# Patient Record
Sex: Male | Born: 1975 | Race: White | Hispanic: No | Smoking: Never smoker
Health system: Southern US, Community
[De-identification: ages and names within clinical notes are randomized; demographics above are authoritative.]

## PROBLEM LIST (undated history)

## (undated) DIAGNOSIS — M549 Dorsalgia, unspecified: Secondary | ICD-10-CM

## (undated) DIAGNOSIS — E7800 Pure hypercholesterolemia, unspecified: Secondary | ICD-10-CM

---

## 2007-06-20 ENCOUNTER — Ambulatory Visit: Payer: Self-pay | Admitting: Urology

## 2007-07-02 ENCOUNTER — Ambulatory Visit: Payer: Self-pay | Admitting: Urology

## 2011-05-05 ENCOUNTER — Ambulatory Visit: Payer: Self-pay

## 2013-03-07 ENCOUNTER — Emergency Department: Payer: Self-pay | Admitting: Emergency Medicine

## 2013-03-07 LAB — COMPREHENSIVE METABOLIC PANEL
ALT: 26 U/L (ref 12–78)
Albumin: 3.9 g/dL (ref 3.4–5.0)
Alkaline Phosphatase: 55 U/L
Anion Gap: 7 (ref 7–16)
BUN: 20 mg/dL — ABNORMAL HIGH (ref 7–18)
Bilirubin,Total: 1.1 mg/dL — ABNORMAL HIGH (ref 0.2–1.0)
CHLORIDE: 105 mmol/L (ref 98–107)
Calcium, Total: 8.4 mg/dL — ABNORMAL LOW (ref 8.5–10.1)
Co2: 25 mmol/L (ref 21–32)
Creatinine: 1.44 mg/dL — ABNORMAL HIGH (ref 0.60–1.30)
EGFR (Non-African Amer.): >60
Glucose: 80 mg/dL (ref 65–99)
Osmolality: 275 (ref 275–301)
POTASSIUM: 3.7 mmol/L (ref 3.5–5.1)
SGOT(AST): 25 U/L (ref 15–37)
Sodium: 137 mmol/L (ref 136–145)
Total Protein: 7.6 g/dL (ref 6.4–8.2)

## 2013-03-07 LAB — DRUG SCREEN, URINE
Amphetamines, Ur Screen: POSITIVE (ref ?–1000)
Barbiturates, Ur Screen: NEGATIVE (ref ?–200)
Benzodiazepine, Ur Scrn: NEGATIVE (ref ?–200)
Cannabinoid 50 Ng, Ur ~~LOC~~: NEGATIVE (ref ?–50)
Cocaine Metabolite,Ur ~~LOC~~: NEGATIVE (ref ?–300)
MDMA (Ecstasy)Ur Screen: NEGATIVE (ref ?–500)
METHADONE, UR SCREEN: NEGATIVE (ref ?–300)
Opiate, Ur Screen: NEGATIVE (ref ?–300)
Phencyclidine (PCP) Ur S: NEGATIVE (ref ?–25)
TRICYCLIC, UR SCREEN: NEGATIVE (ref ?–1000)

## 2013-03-07 LAB — URINALYSIS, COMPLETE
BILIRUBIN, UR: NEGATIVE
BLOOD: NEGATIVE
Bacteria: NONE SEEN
Glucose,UR: NEGATIVE mg/dL (ref 0–75)
Leukocyte Esterase: NEGATIVE
Nitrite: NEGATIVE
Ph: 5 (ref 4.5–8.0)
Protein: NEGATIVE
RBC,UR: 1 /HPF (ref 0–5)
SPECIFIC GRAVITY: 1.027 (ref 1.003–1.030)
WBC UR: 4 /HPF (ref 0–5)

## 2013-03-07 LAB — CBC
HCT: 47.3 % (ref 40.0–52.0)
HGB: 16.4 g/dL (ref 13.0–18.0)
MCH: 30.1 pg (ref 26.0–34.0)
MCHC: 34.6 g/dL (ref 32.0–36.0)
MCV: 87 fL (ref 80–100)
Platelet: 193 10*3/uL (ref 150–440)
RBC: 5.43 10*6/uL (ref 4.40–5.90)
RDW: 13.7 % (ref 11.5–14.5)
WBC: 7.2 10*3/uL (ref 3.8–10.6)

## 2013-03-07 LAB — SALICYLATE LEVEL

## 2013-03-07 LAB — ACETAMINOPHEN LEVEL: Acetaminophen: <2 ug/mL

## 2013-03-07 LAB — ETHANOL: Ethanol: <3 mg/dL

## 2014-05-31 NOTE — Consult Note (Signed)
Brief Consult Note: Diagnosis: Major depressive disorder.   Patient was seen by consultant.   Consult note dictated.   Recommend further assessment or treatment.   Comments: [name] has a h/o depression and annxiety treated by Dr. Lucianne [name]. He took four 0.5 mg tablets of newly prescribed Clonazepam. He adamantly denies suicidal intention. He is not suicidal or homicidal today.   PLAN: 1. The patient no longer meets criteria for IVC. I will terminate proceedings. Please discharge as appropriate.  2. He is to continue medications as prescribed by Dr. Lucianne [name]. No Rx necessary.  3. He will follow up with Dr. Lucianne [name] on Feb 12.  Electronic Signatures: George [name], George [name] (MD)  (Signed 30-Jan-15 13:09)  Authored: Brief Consult Note   Last Updated: 30-Jan-15 13:09 by George [name], George [name] (MD)

## 2014-05-31 NOTE — Consult Note (Signed)
George [name] NAME:  George, [name] MR#:  [id number] DATE OF BIRTH:  1975-10-08  DATE OF CONSULTATION:  03/08/2013  REFERRING PHYSICIAN:  Bayard Males, M.D. CONSULTING PHYSICIAN:  Aydee Mcnew B. Deion Swift, MD  REASON FOR CONSULTATION:  To evaluate a George [name] after a suicide attempt.   IDENTIFYING DATA:  George George [name] is a 39 year old male with history of depression, anxiety, and attention deficit/hyperactivity disorder.   CHIEF COMPLAINT:  "I am fine now."   HISTORY OF PRESENT ILLNESS:  George George [name] is a George [name] of Dr. Lucianne Muss who prescribes Viibryd for depression, Adderall for ADHD and started him on clonazepam 0.5 mg twice daily as needed for anxiety.  On George day of admission, George George [name] was very tired after working long hours for several days and unable to fall asleep.  Instead of one tablet for anxiety he took four 0.5 mg tablets at 6:00 in George afternoon hoping to fall asleep.  Indeed he did fall asleep, but was awakened by his girlfriend at 2:00 in George morning.  He was incoherent, unsteady on his feet, stumbling around and his girlfriend called police.  He was brought to George Emergency Room where he was initially too sleepy to be evaluated.  Reportedly he had some allergies and was given Benadryl IM by Emergency Room physician.  We could not talk to this George [name] throughout January George 29th.  George following day he was alert and oriented and was able to provide [place] with his side of George story.  George George [name] has never in his life taken benzodiazepines and did not realize how big effect this type of medication may have.  He adamantly denies that it was a suicide attempt and denies any symptoms of depression or anxiety.  He has been working with Dr. Lucianne Muss since December and feels that George medication that she prescribes has been very helpful.  He has a next appointment with her in February scheduled already.  He denies alcohol or illicit substance use.   PAST PSYCHIATRIC HISTORY:  He has a history of  depression and ADHD, but no suicide attempts.  No hospitalizations.  No substance abuse problems.   FAMILY PSYCHIATRIC HISTORY:  Mother with anxiety.   PAST MEDICAL HISTORY:  None.   ALLERGIES:  No known drug allergies.   MEDICATIONS ON ADMISSION:  Adderall 30 mg daily, Viibryd 40 mg daily, Klonopin 0.5 mg twice daily.   SOCIAL HISTORY:  He is separated from his wife.  He lives with his girlfriend who is a Engineer, site and has four children.  He has a stormy relationship with his girlfriend.  He used to be in therapy, but this stopped since his wife cancelled his insurance.  He is employed.   REVIEW OF SYSTEMS:  CONSTITUTIONAL:  No fevers or chills.  No weight changes.  EYES:  No double or blurred vision.  EARS, NOSE, THROAT:  No hearing loss.  RESPIRATORY:  No shortness of breath or cough.  CARDIOVASCULAR:  No chest pain or orthopnea.  GASTROINTESTINAL:  No abdominal pain, nausea, vomiting, or diarrhea.  GENITOURINARY:  No incontinence or frequency.  ENDOCRINE:  No heat or cold intolerance.  LYMPHATIC:  No anemia or easy bruising.  INTEGUMENTARY:  No acne or rash.  MUSCULOSKELETAL:  No muscle or joint pain.  NEUROLOGIC:  No tingling or weakness.  PSYCHIATRIC:  See history of present illness for details.   PHYSICAL EXAMINATION: VITAL SIGNS:  Blood pressure 141/75, pulse 86, respirations 18, temperature 97.4.  GENERAL:  This is a well-developed male  in no acute distress.  George rest of George physical examination is deferred to his primary attending.   LABORATORY DATA:  Chemistries are within normal limits except for BUN of 20 and creatinine 1.44.  Blood alcohol level 0.  LFTs within normal limits except for bilirubin of 1.1.  Urine tox screen positive for amphetamines.  CBC within normal limits.  Urinalysis is not suggestive of urinary tract infection.  Serum acetaminophen and salicylates are low.   MENTAL STATUS EXAMINATION:  George George [name] is alert and oriented to person, place, time  and situation.  He is pleasant, polite and cooperative.  He is well groomed.  He wears hospital scrubs and a yellow shirt.  He maintains good eye contact.  His speech is of normal rhythm, rate and volume.  His mood is good with full affect.  Thought process is logical and goal oriented.  Thought content:  He denies suicidal or homicidal ideation.  There are no delusions or paranoia.  There are no auditory or visual hallucinations.  His cognition is grossly intact. He registers 3/3 and recalls 3/3 objects after 5 minutes. He can spell "world" forward and backward. He knows current president. His intelligence and fund of knowledge are average. His insight and judgment are fair.    DIAGNOSES: AXIS I:  Major depressive disorder, recurrent, moderate.  Anxiety disorder, not otherwise specified.  Attention deficit hyperactivity disorder.  AXIS II:  Deferred.  AXIS III:  Deferred.  AXIS IV:  Mental illness, marital conflict, relationship problems.  AXIS V:  Global assessment of functioning 55.   PLAN:   1.  George George [name] no longer meets criteria for involuntary inpatient psychiatric commitment.  Please discharge as appropriate.  I will terminate proceedings.   2.  He is to continue all medications as prescribed by Dr. Lucianne MussLima.  No prescriptions necessary.  3.  He will follow up with Dr. Lucianne MussLima on February George 12th.    ____________________________ Ellin GoodieJolanta B. Jennet MaduroPucilowska, MD jbp:ea D: 03/08/2013 22:18:24 ET T: 03/09/2013 03:40:23 ET JOB#: [id number]  cc: Nabeel Gladson B. Jennet MaduroPucilowska, MD, <Dictator> Shari ProwsJOLANTA B Renuka Farfan MD ELECTRONICALLY SIGNED 04/06/2013 6:44

## 2019-07-15 ENCOUNTER — Encounter: Payer: Self-pay | Admitting: Emergency Medicine

## 2019-07-15 ENCOUNTER — Emergency Department: Payer: BLUE CROSS/BLUE SHIELD

## 2019-07-15 ENCOUNTER — Other Ambulatory Visit: Payer: Self-pay

## 2019-07-15 ENCOUNTER — Emergency Department
Admission: EM | Admit: 2019-07-15 | Discharge: 2019-07-15 | Disposition: A | Discharge status: Home / Self Care | Payer: BLUE CROSS/BLUE SHIELD | Attending: Emergency Medicine | Admitting: Emergency Medicine

## 2019-07-15 DIAGNOSIS — Z79899 Other long term (current) drug therapy: Secondary | ICD-10-CM | POA: Insufficient documentation

## 2019-07-15 DIAGNOSIS — M5412 Radiculopathy, cervical region: Secondary | ICD-10-CM | POA: Insufficient documentation

## 2019-07-15 DIAGNOSIS — R0602 Shortness of breath: Secondary | ICD-10-CM | POA: Insufficient documentation

## 2019-07-15 DIAGNOSIS — M5410 Radiculopathy, site unspecified: Secondary | ICD-10-CM

## 2019-07-15 DIAGNOSIS — M542 Cervicalgia: Secondary | ICD-10-CM | POA: Diagnosis present

## 2019-07-15 HISTORY — DX: Dorsalgia, unspecified: M54.9

## 2019-07-15 HISTORY — DX: Pure hypercholesterolemia, unspecified: E78.00

## 2019-07-15 LAB — CBC WITH DIFFERENTIAL/PLATELET
Abs Immature Granulocytes: 0.01 10*3/uL (ref 0.00–0.07)
Basophils Absolute: 0 10*3/uL (ref 0.0–0.1)
Basophils Relative: 1 %
Eosinophils Absolute: 0 10*3/uL (ref 0.0–0.5)
Eosinophils Relative: 0 %
HCT: 44 % (ref 39.0–52.0)
Hemoglobin: 15.5 g/dL (ref 13.0–17.0)
Immature Granulocytes: 0 %
Lymphocytes Relative: 38 %
Lymphs Abs: 2.3 10*3/uL (ref 0.7–4.0)
MCH: 29.2 pg (ref 26.0–34.0)
MCHC: 35.2 g/dL (ref 30.0–36.0)
MCV: 83 fL (ref 80.0–100.0)
Monocytes Absolute: 0.4 10*3/uL (ref 0.1–1.0)
Monocytes Relative: 7 %
Neutro Abs: 3.2 10*3/uL (ref 1.7–7.7)
Neutrophils Relative %: 54 %
Platelets: 230 10*3/uL (ref 150–400)
RBC: 5.3 MIL/uL (ref 4.22–5.81)
RDW: 12.6 % (ref 11.5–15.5)
WBC: 6 10*3/uL (ref 4.0–10.5)
nRBC: 0 % (ref 0.0–0.2)

## 2019-07-15 LAB — COMPREHENSIVE METABOLIC PANEL
ALT: 28 U/L (ref 0–44)
AST: 22 U/L (ref 15–41)
Albumin: 4.5 g/dL (ref 3.5–5.0)
Alkaline Phosphatase: 65 U/L (ref 38–126)
Anion gap: 10 (ref 5–15)
BUN: 13 mg/dL (ref 6–20)
CO2: 23 mmol/L (ref 22–32)
Calcium: 9.1 mg/dL (ref 8.9–10.3)
Chloride: 107 mmol/L (ref 98–111)
Creatinine, Ser: 1.22 mg/dL (ref 0.61–1.24)
GFR calc Af Amer: >60 mL/min (ref >60)
GFR calc non Af Amer: >60 mL/min (ref >60)
Glucose, Bld: 95 mg/dL (ref 70–99)
Potassium: 4 mmol/L (ref 3.5–5.1)
Sodium: 140 mmol/L (ref 135–145)
Total Bilirubin: 0.9 mg/dL (ref 0.3–1.2)
Total Protein: 7.6 g/dL (ref 6.5–8.1)

## 2019-07-15 LAB — TROPONIN I (HIGH SENSITIVITY): Troponin I (High Sensitivity): <2 ng/L (ref <18)

## 2019-07-15 LAB — FIBRIN DERIVATIVES D-DIMER (ARMC ONLY): Fibrin derivatives D-dimer (ARMC): 245.38 ng/mL (FEU) (ref 0.00–499.00)

## 2019-07-15 NOTE — ED Triage Notes (Signed)
EMS reports they were called out for "pressure" in right side of neck and right arm. Patient and family concerned of possible cardiac issues d/t family history. 12 lead unremarkable. Patient has hx of herniated disc.

## 2019-07-15 NOTE — ED Provider Notes (Signed)
[facility] Emergency Department Provider Note ____________________________________________   First MD Initiated Contact with Patient 07/15/19 1420     (approximate)  I have reviewed the triage vital signs and the nursing notes.   HISTORY  Chief Complaint Neck Pain  HPI George [name] is a 44 y.o. malewith a history of back pain and high cholesterol on statin who presents to the emergency department for treatment of right and left neck pain that started several days ago. Pain is worse on the right today and radiates into his right arm. No known injury. He is also having some shortness of breath with exertion. He denies chest pain. No relief with Naprosyn.      Past Medical History:  Diagnosis Date  . Back pain   . Hypercholesterolemia     There are no problems to display for this patient.   History reviewed. No pertinent surgical history.  Prior to Admission medications   Medication Sig Start Date End Date Taking? Authorizing Provider  naproxen (NAPROSYN) 500 MG tablet Take 500 mg by mouth 2 (two) times daily with a meal.   Yes [provider]  rosuvastatin (CRESTOR) 5 MG tablet Take 5 mg by mouth daily.   Yes [provider]  traZODone (DESYREL) 100 MG tablet Take 100 mg by mouth at bedtime.   Yes [provider]    Allergies Patient has no known allergies.  No family history on file.  Social History Social History   Tobacco Use  . Smoking status: Never Smoker  . Smokeless tobacco: Never Used  Substance Use Topics  . Alcohol use: Never  . Drug use: Not on file    Review of Systems  Constitutional: No fever/chills Eyes: No visual changes. ENT: No sore throat. Cardiovascular: Denies chest pain. Respiratory: Positive for shortness of breath. Gastrointestinal: No abdominal pain.  No nausea, no vomiting.  No diarrhea.  No constipation. Genitourinary: Negative for dysuria. Musculoskeletal: Positive for  neck pain. Negative for back pain. Skin: Negative for rash. Neurological: Negative for headaches, focal weakness or numbness. ___________________________________________   PHYSICAL EXAM:  VITAL SIGNS: ED Triage Vitals  Enc Vitals Group     BP 07/15/19 1421 140/78     Pulse Rate 07/15/19 1421 78     Resp 07/15/19 1421 18     Temp 07/15/19 1421 98.7 F (37.1 C)     Temp Source 07/15/19 1421 Oral     SpO2 07/15/19 1421 98 %     Weight 07/15/19 1416 220 lb (99.8 kg)     Height 07/15/19 1416 6' (1.829 m)     Head Circumference --      Peak Flow --      Pain Score 07/15/19 1416 2     Pain Loc --      Pain Edu? --      Excl. in GC? --     Constitutional: Alert and oriented. Well appearing and in no acute distress. Eyes: Conjunctivae are normal. Head: Atraumatic. Nose: No congestion/rhinnorhea. Mouth/Throat: Mucous membranes are moist.  Oropharynx non-erythematous. Neck: No stridor.   Hematological/Lymphatic/Immunilogical: No cervical lymphadenopathy. Cardiovascular: Normal rate, regular rhythm. Grossly normal heart sounds.  Good peripheral circulation. Respiratory: Normal respiratory effort.  No retractions. Lungs CTAB. Gastrointestinal: Soft and nontender. No distention. No abdominal bruits. No CVA tenderness. Genitourinary:  Musculoskeletal: No focal tenderness over either side of the neck. No focal midline tenderness. No lower extremity tenderness nor edema.  No joint effusions. Neurologic:  Normal speech  and language. No gross focal neurologic deficits are appreciated. No gait instability. Skin:  Skin is warm, dry and intact. No rash noted. Psychiatric: Mood and affect are normal. Speech and behavior are normal.  ____________________________________________   LABS (all labs ordered are listed, but only abnormal results are displayed)  Labs Reviewed  CBC WITH DIFFERENTIAL/PLATELET  COMPREHENSIVE METABOLIC PANEL  FIBRIN DERIVATIVES D-DIMER ([facility] ONLY)  TROPONIN I (HIGH  SENSITIVITY)  TROPONIN I (HIGH SENSITIVITY)   ____________________________________________  EKG  ED ECG REPORT I, Thalya Fouche, FNP-BC personally viewed and interpreted this ECG.   Date: 07/15/2019  EKG Time: 1518  Rate: 85  Rhythm: normal sinus rhythm  Axis: normal  Intervals:none  ST&T Change: no ST elevation  ____________________________________________  RADIOLOGY  ED MD interpretation:    Chest x-ray is negative for acute cardiopulmonary abnormality. I, Kem Boroughs, personally viewed and evaluated these images (plain radiographs) as part of my medical decision making, as well as reviewing the written report by the radiologist.  Official radiology report(s): DG Chest 2 View  Result Date: 07/15/2019 CLINICAL DATA:  Shortness of breath EXAM: CHEST - 2 VIEW COMPARISON:  None. FINDINGS: Lungs are clear. Heart size and pulmonary vascularity are normal. No adenopathy. No pneumothorax. No bone lesions. IMPRESSION: Lungs clear.  Cardiac silhouette within normal limits. Electronically Signed   By: Bretta Bang III M.D.   On: 07/15/2019 15:22    ____________________________________________   PROCEDURES  Procedure(s) performed (including Critical Care):  Procedures  ____________________________________________   INITIAL IMPRESSION / ASSESSMENT AND PLAN     44 year old male presenting to the emergency department for treatment and evaluation of vague symptoms as described in the HPI.  He is concerned that he may have something wrong with his carotid arteries or his heart or his lungs.  He states that his dad had heart disease at age 6.  He himself has not had any cardiac history.  He denies any recent injury.  Sometimes his neck hurts on the right side sometimes it hurts on the left side.  Plan will be to do some screening labs including a troponin and a D-dimer since he has had some occasional shortness of breath with exertion.  We will also get an EKG and chest  x-ray.  DIFFERENTIAL DIAGNOSIS  Anxiety, CAD, PE, musculoskeletal pain, cervical and/or lumbar radiculopathy  ED COURSE Troponin is negative, D-dimer is normal, and CBC and CMP are completely unremarkable.  Chest x-ray is normal.  EKG is normal.  Today symptoms most likely related to anxiety.  Patient has an appointment scheduled with both his orthopedist as well as primary care.  He was offered prescription for meloxicam to be taken in place of Naprosyn but he states that he had tried that in the past and was taken off of it.  He was encouraged to continue his routine daily medicines and keep his scheduled appointments. ____________________________________________   FINAL CLINICAL IMPRESSION(S) / ED DIAGNOSES  Final diagnoses:  Shortness of breath  Radicular pain     ED Discharge Orders    None       Alonza Knisley Kyler was evaluated in Emergency Department on 07/15/2019 for the symptoms described in the history of present illness. He was evaluated in the context of the global COVID-19 pandemic, which necessitated consideration that the patient might be at risk for infection with the SARS-CoV-2 virus that causes COVID-19. Institutional protocols and algorithms that pertain to the evaluation of patients at risk for COVID-19 are in a state of  rapid change based on information released by regulatory bodies including the CDC and federal and state organizations. These policies and algorithms were followed during the patient's care in the ED.   Note:  This document was prepared using Dragon voice recognition software and may include unintentional dictation errors.   Victorino Dike, FNP 07/15/19 1751    Earleen Newport, MD 07/16/19 [phone]

## 2019-07-15 NOTE — Discharge Instructions (Signed)
Please follow-up with your orthopedic doctor as scheduled.  Continue taking your Naprosyn as prescribed.  You may also take Tylenol in addition to the Naprosyn.  Please discuss all of your symptoms with your primary care provider as well as your orthopedist.  Return to the emergency department for symptoms of change or worsen if you are unable to see primary care right away.

## 2019-11-14 ENCOUNTER — Emergency Department: Payer: BLUE CROSS/BLUE SHIELD

## 2019-11-14 ENCOUNTER — Encounter: Payer: Self-pay | Admitting: *Deleted

## 2019-11-14 ENCOUNTER — Other Ambulatory Visit: Payer: Self-pay

## 2019-11-14 ENCOUNTER — Emergency Department
Admission: EM | Admit: 2019-11-14 | Discharge: 2019-11-15 | Disposition: A | Discharge status: Home / Self Care | Payer: BLUE CROSS/BLUE SHIELD | Attending: Emergency Medicine | Admitting: Emergency Medicine

## 2019-11-14 DIAGNOSIS — R0781 Pleurodynia: Secondary | ICD-10-CM | POA: Diagnosis not present

## 2019-11-14 DIAGNOSIS — Z5321 Procedure and treatment not carried out due to patient leaving prior to being seen by health care provider: Secondary | ICD-10-CM | POA: Diagnosis not present

## 2019-11-14 DIAGNOSIS — R059 Cough, unspecified: Secondary | ICD-10-CM | POA: Insufficient documentation

## 2019-11-14 LAB — BASIC METABOLIC PANEL
Anion gap: 11 (ref 5–15)
BUN: 17 mg/dL (ref 6–20)
CO2: 25 mmol/L (ref 22–32)
Calcium: 9.3 mg/dL (ref 8.9–10.3)
Chloride: 102 mmol/L (ref 98–111)
Creatinine, Ser: 1.08 mg/dL (ref 0.61–1.24)
GFR calc non Af Amer: >60 mL/min (ref >60)
Glucose, Bld: 120 mg/dL — ABNORMAL HIGH (ref 70–99)
Potassium: 4.7 mmol/L (ref 3.5–5.1)
Sodium: 138 mmol/L (ref 135–145)

## 2019-11-14 LAB — TROPONIN I (HIGH SENSITIVITY): Troponin I (High Sensitivity): 4 ng/L (ref <18)

## 2019-11-14 LAB — CBC
HCT: 42.1 % (ref 39.0–52.0)
Hemoglobin: 14 g/dL (ref 13.0–17.0)
MCH: 28.7 pg (ref 26.0–34.0)
MCHC: 33.3 g/dL (ref 30.0–36.0)
MCV: 86.4 fL (ref 80.0–100.0)
Platelets: 611 10*3/uL — ABNORMAL HIGH (ref 150–400)
RBC: 4.87 MIL/uL (ref 4.22–5.81)
RDW: 13 % (ref 11.5–15.5)
WBC: 14.7 10*3/uL — ABNORMAL HIGH (ref 4.0–10.5)
nRBC: 0 % (ref 0.0–0.2)

## 2019-11-14 MED ORDER — ACETAMINOPHEN 325 MG PO TABS
650.0000 mg | ORAL_TABLET | Freq: Once | ORAL | Status: AC
  Administered 2019-11-14: 650 mg via ORAL
  Filled 2019-11-14: qty 2

## 2019-11-14 NOTE — ED Notes (Signed)
Pt jumps up out of w/c and begins yelling and cursing at other patients sitting in lobby because their "phone is too loud"; pt then exits lobby cont to scream and curse; security notified and pt st leaving

## 2019-11-14 NOTE — ED Triage Notes (Signed)
Pt to triage via wheelchair  Pt sent from [facility] for eval of sob, covid.  Pt states everyone in his family has covid.  Pt has a cough and right rib pain.  Sx for 2 days.   Pt alert, speech clear.

## 2019-11-14 NOTE — ED Triage Notes (Signed)
First nurse note- here for covid sx, sx bad for 16 days. Sat 93 at check in. Tachy and febrile.

## 2019-12-30 ENCOUNTER — Other Ambulatory Visit: Payer: Self-pay | Admitting: *Deleted

## 2021-03-02 ENCOUNTER — Other Ambulatory Visit: Payer: Self-pay | Admitting: Family Medicine

## 2021-03-02 DIAGNOSIS — R1011 Right upper quadrant pain: Secondary | ICD-10-CM

## 2021-03-10 ENCOUNTER — Ambulatory Visit
Admission: RE | Admit: 2021-03-10 | Discharge: 2021-03-10 | Disposition: A | Discharge status: Home / Self Care | Payer: 59 | Source: Ambulatory Visit | Attending: Family Medicine | Admitting: Family Medicine

## 2021-03-10 ENCOUNTER — Other Ambulatory Visit: Payer: Self-pay

## 2021-03-10 DIAGNOSIS — R1011 Right upper quadrant pain: Secondary | ICD-10-CM | POA: Diagnosis present

## 2021-04-13 ENCOUNTER — Other Ambulatory Visit: Payer: Self-pay | Admitting: Neurosurgery

## 2021-04-13 DIAGNOSIS — M545 Low back pain, unspecified: Secondary | ICD-10-CM

## 2021-04-13 DIAGNOSIS — G8929 Other chronic pain: Secondary | ICD-10-CM

## 2021-04-21 ENCOUNTER — Ambulatory Visit: Payer: 59

## 2021-09-13 IMAGING — CR DG CHEST 2V
1 series · 2 of 2 positions shown · non-contrast
Comparison: None.

CLINICAL DATA: Shortness of breath

EXAM:
CHEST - 2 VIEW

[Series 1: dg chest 2 view · 0.14mm/px · 2 of 2 slices shown]
[im 1/2]
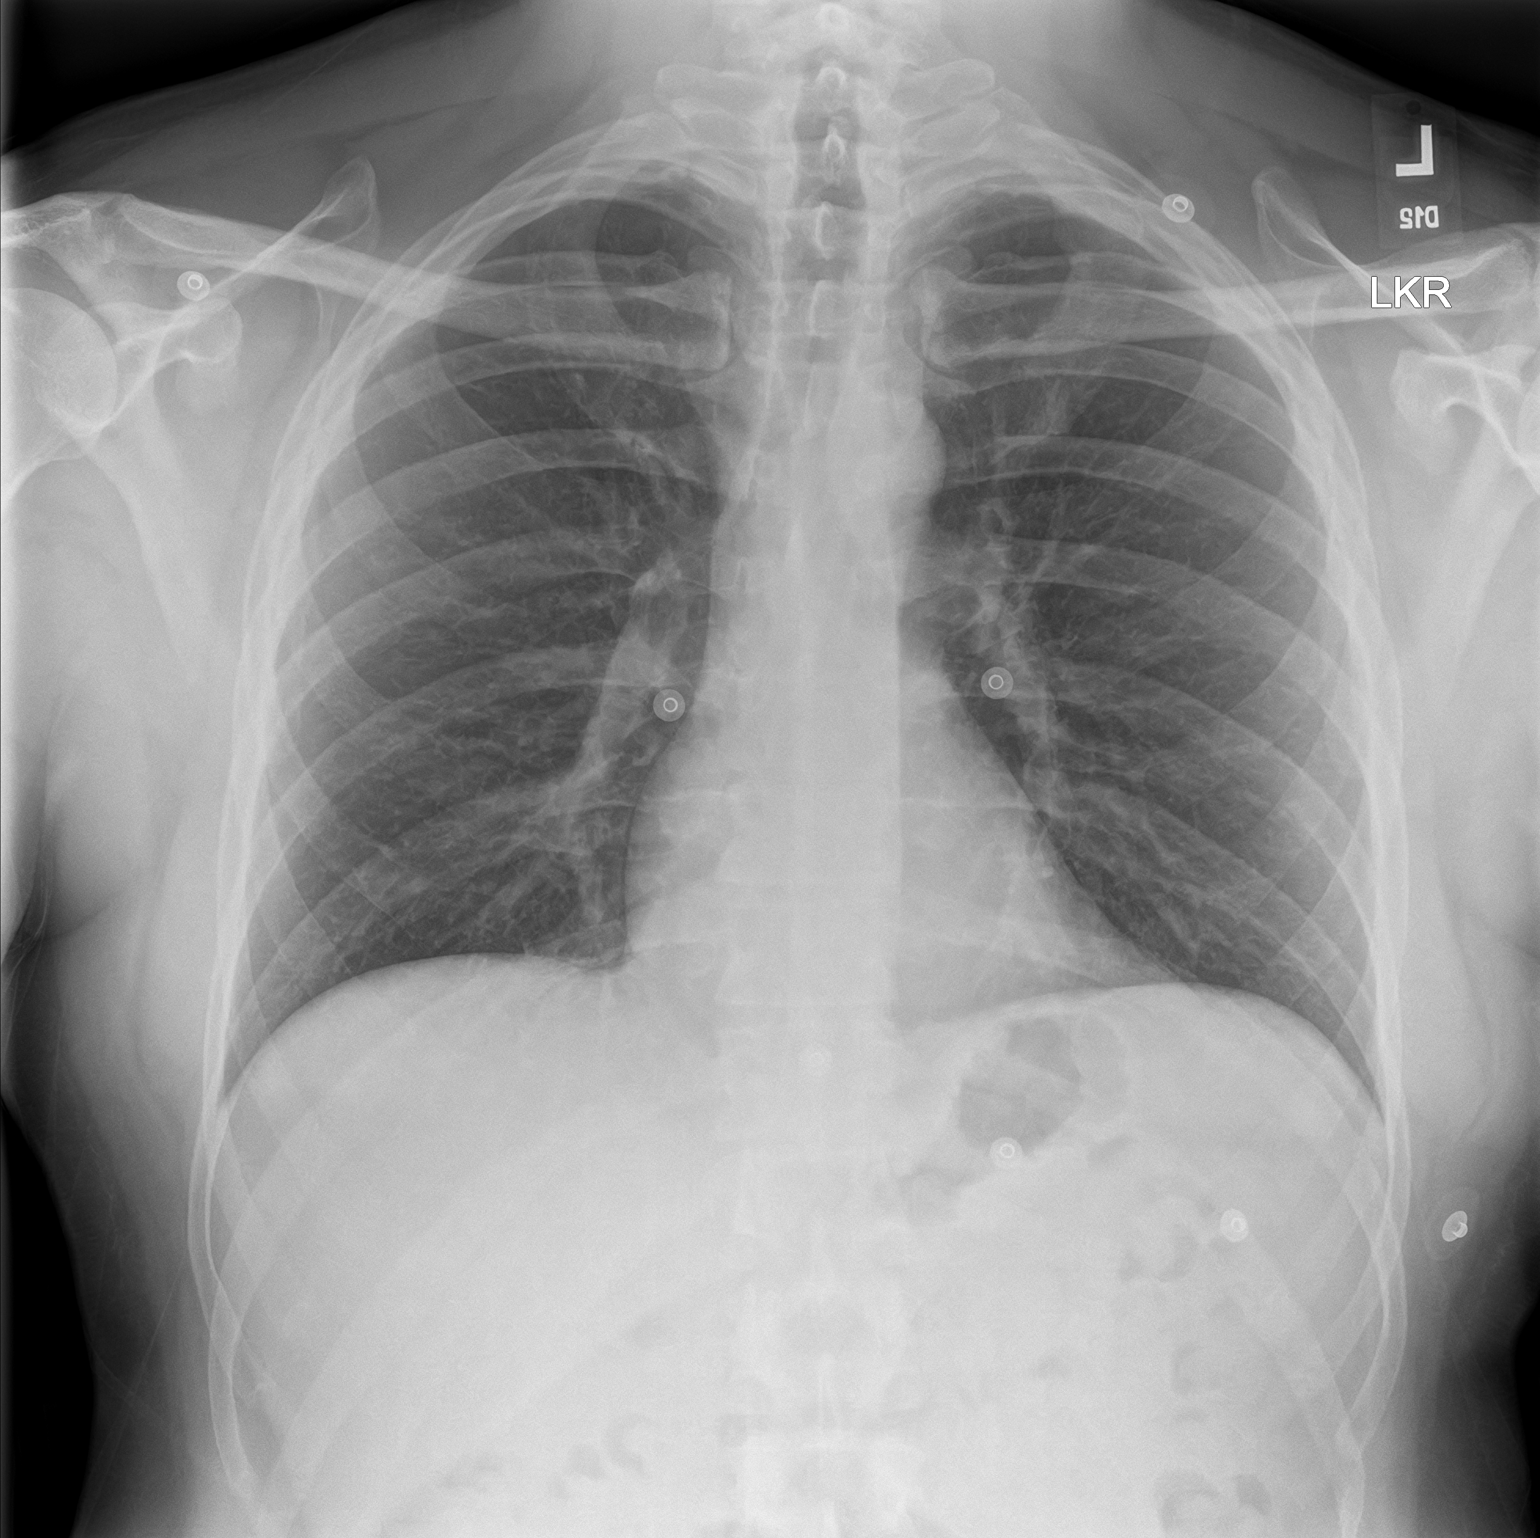
[im 2/2]
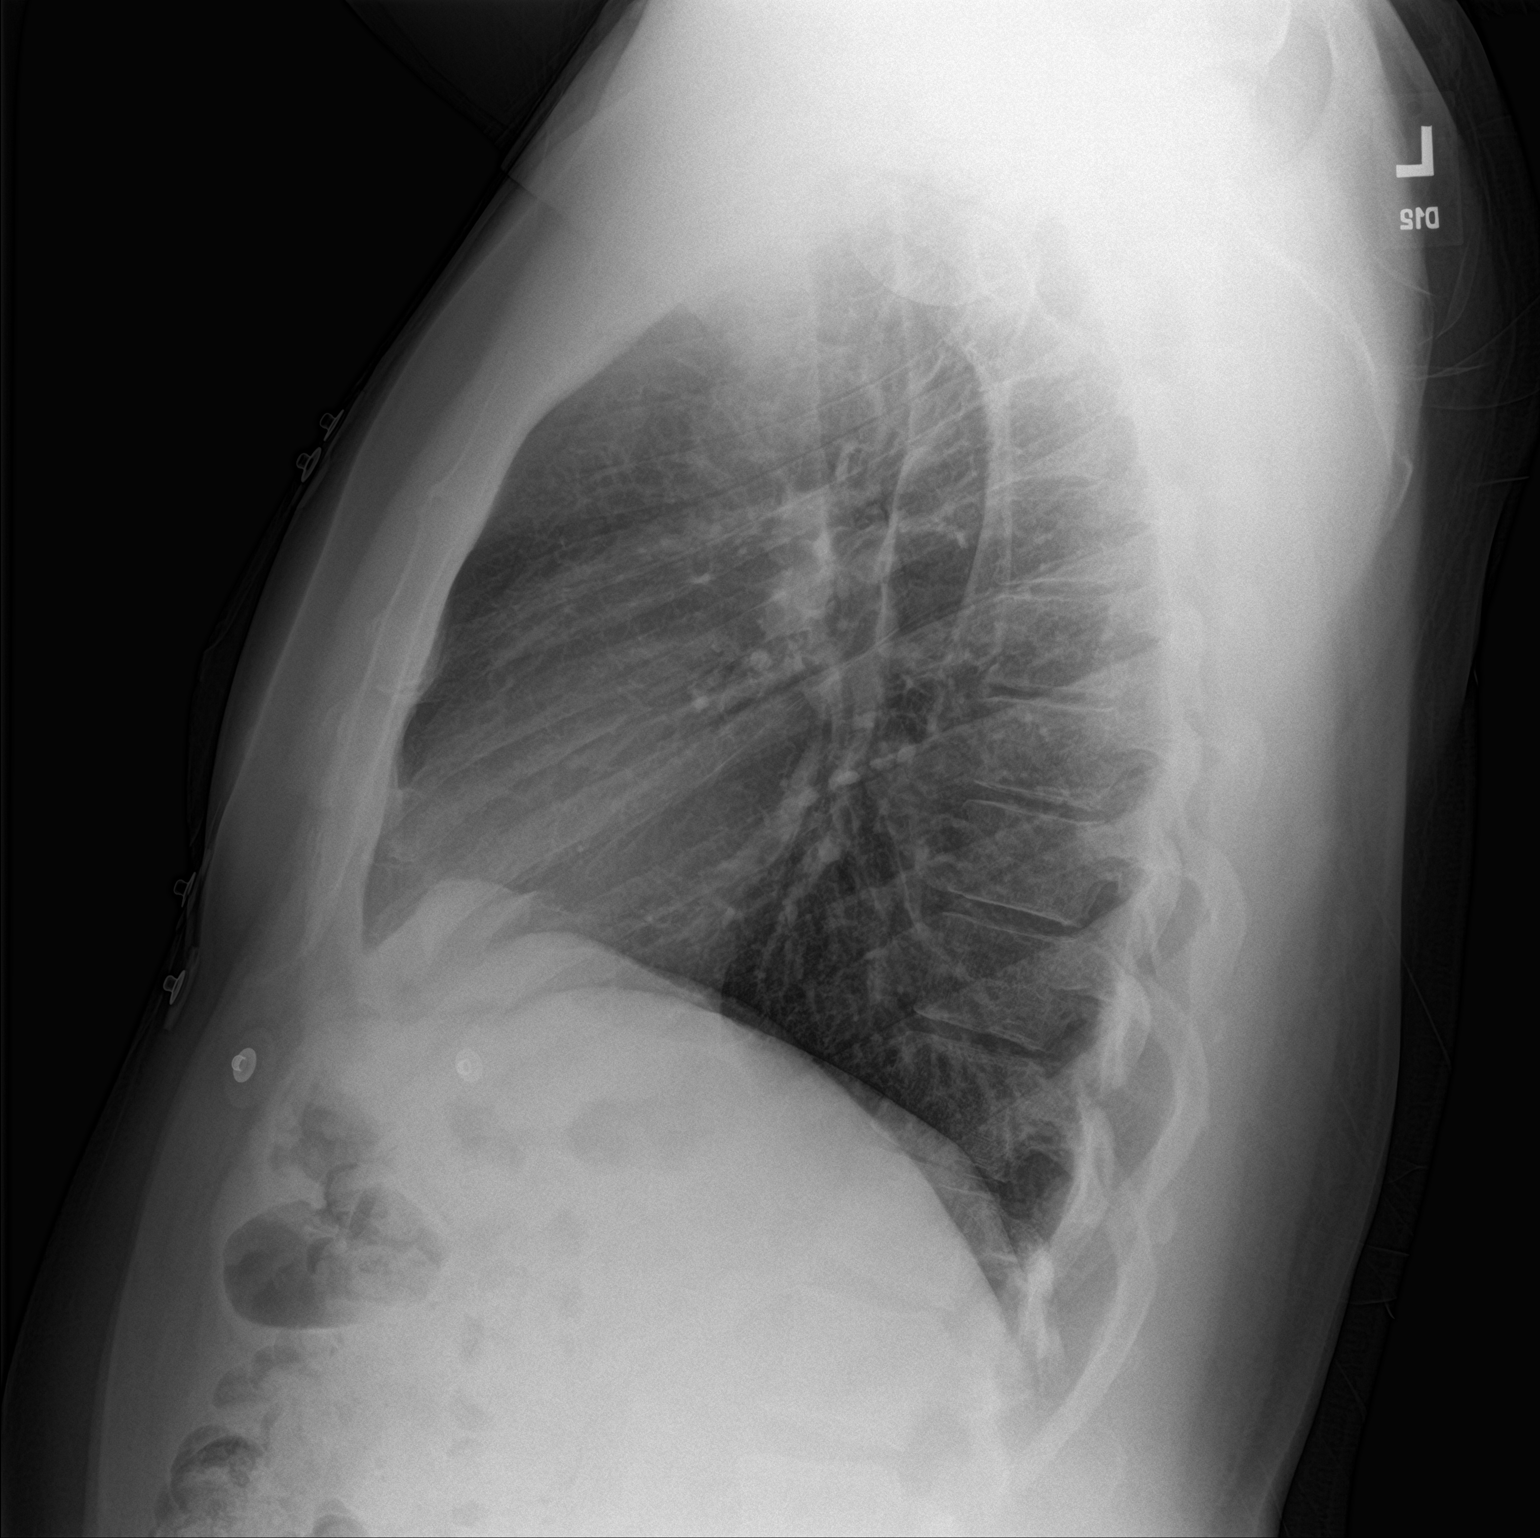

[2 of 2 positions shown; findings below may reference images not displayed]

FINDINGS: Lungs are clear. Heart size and pulmonary vascularity are normal. No
adenopathy. No pneumothorax. No bone lesions.
IMPRESSION: Lungs clear.  Cardiac silhouette within normal limits.

## 2022-01-13 IMAGING — CR DG CHEST 2V
1 series · 2 of 2 positions shown · non-contrast
Comparison: Chest radiograph dated 07/15/2019.

CLINICAL DATA: 44-year-old male with cough. [id number] exposure.

EXAM:
CHEST - 2 VIEW

[Series 1: w chest pa · 0.14mm/px · 2 of 2 slices shown]
[im 1/2]
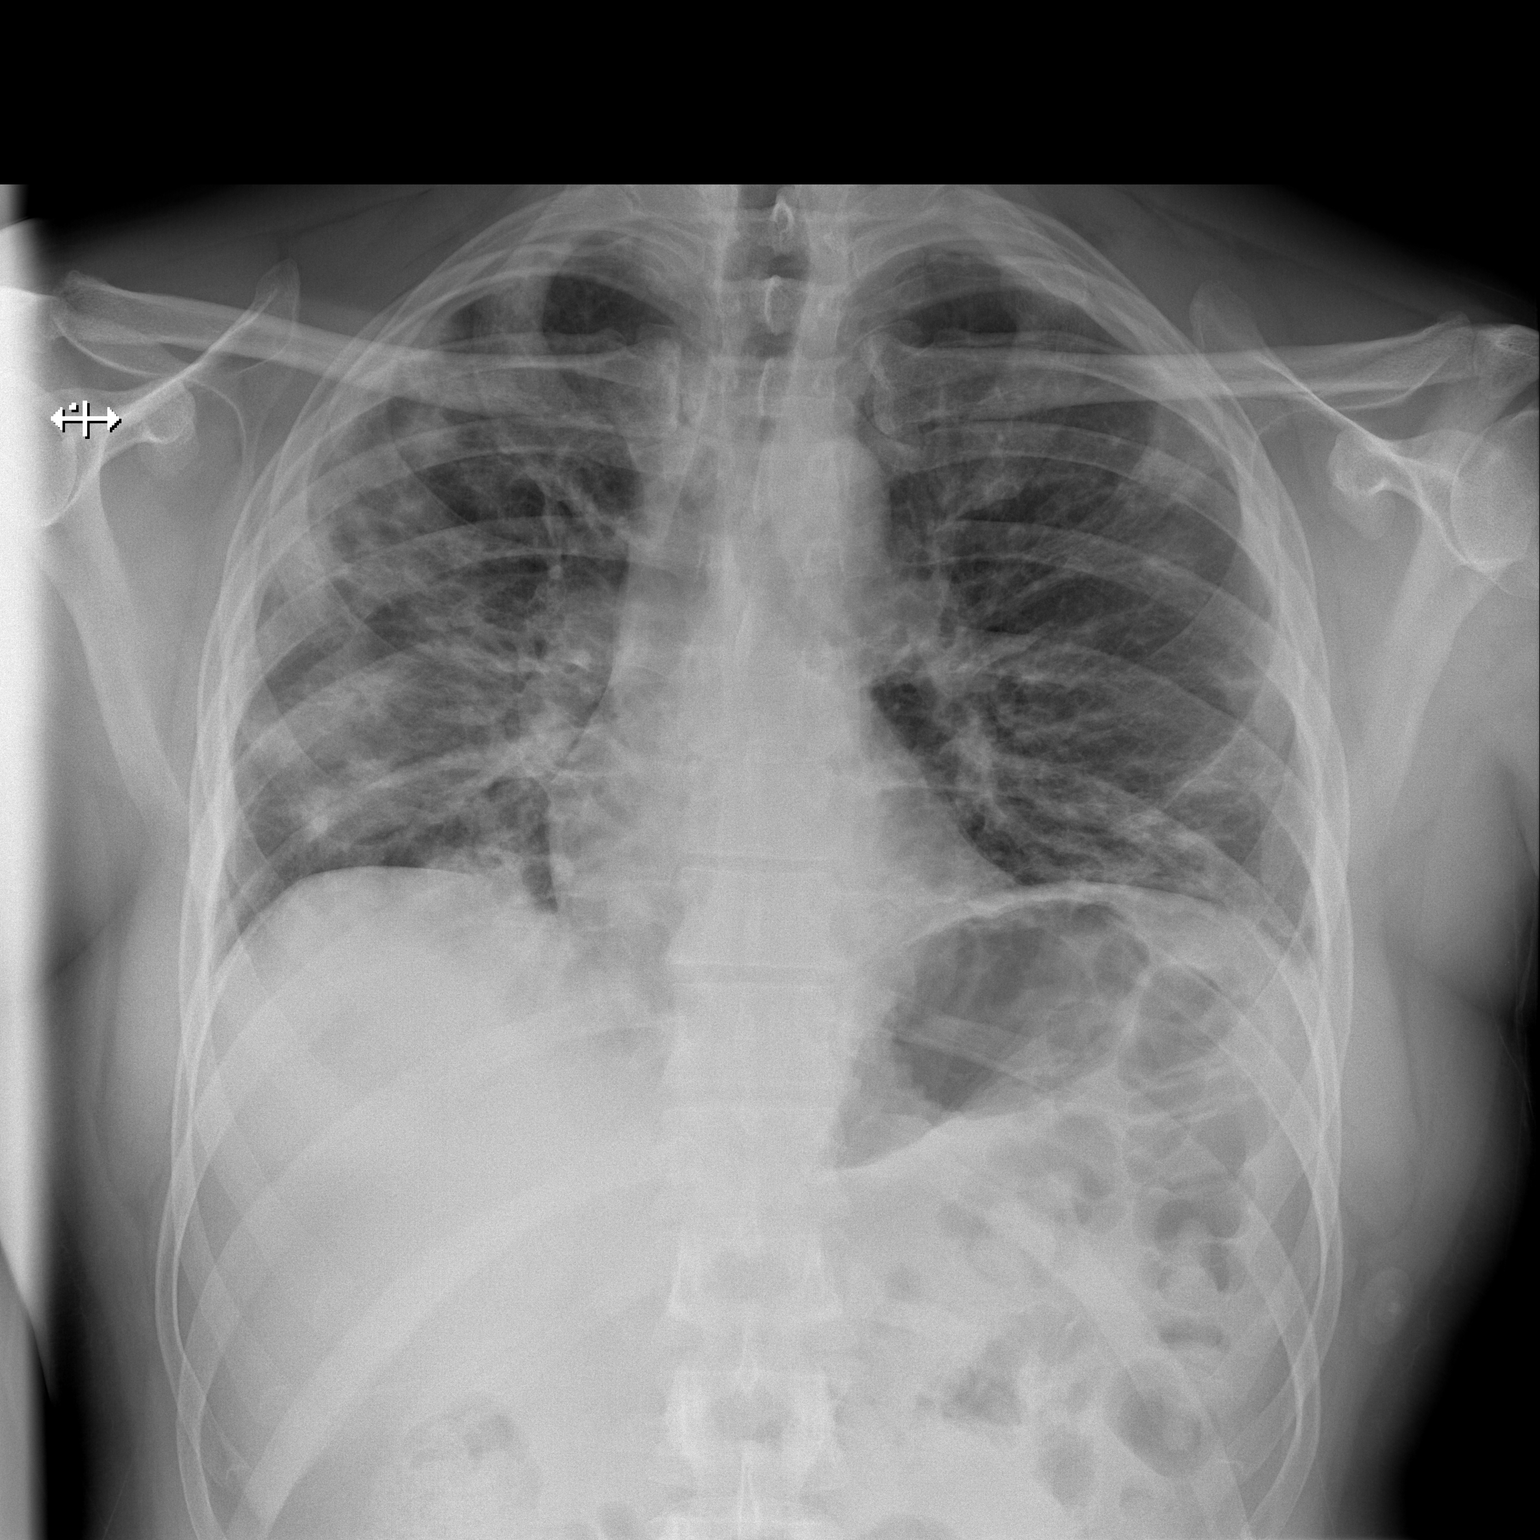
[im 2/2]
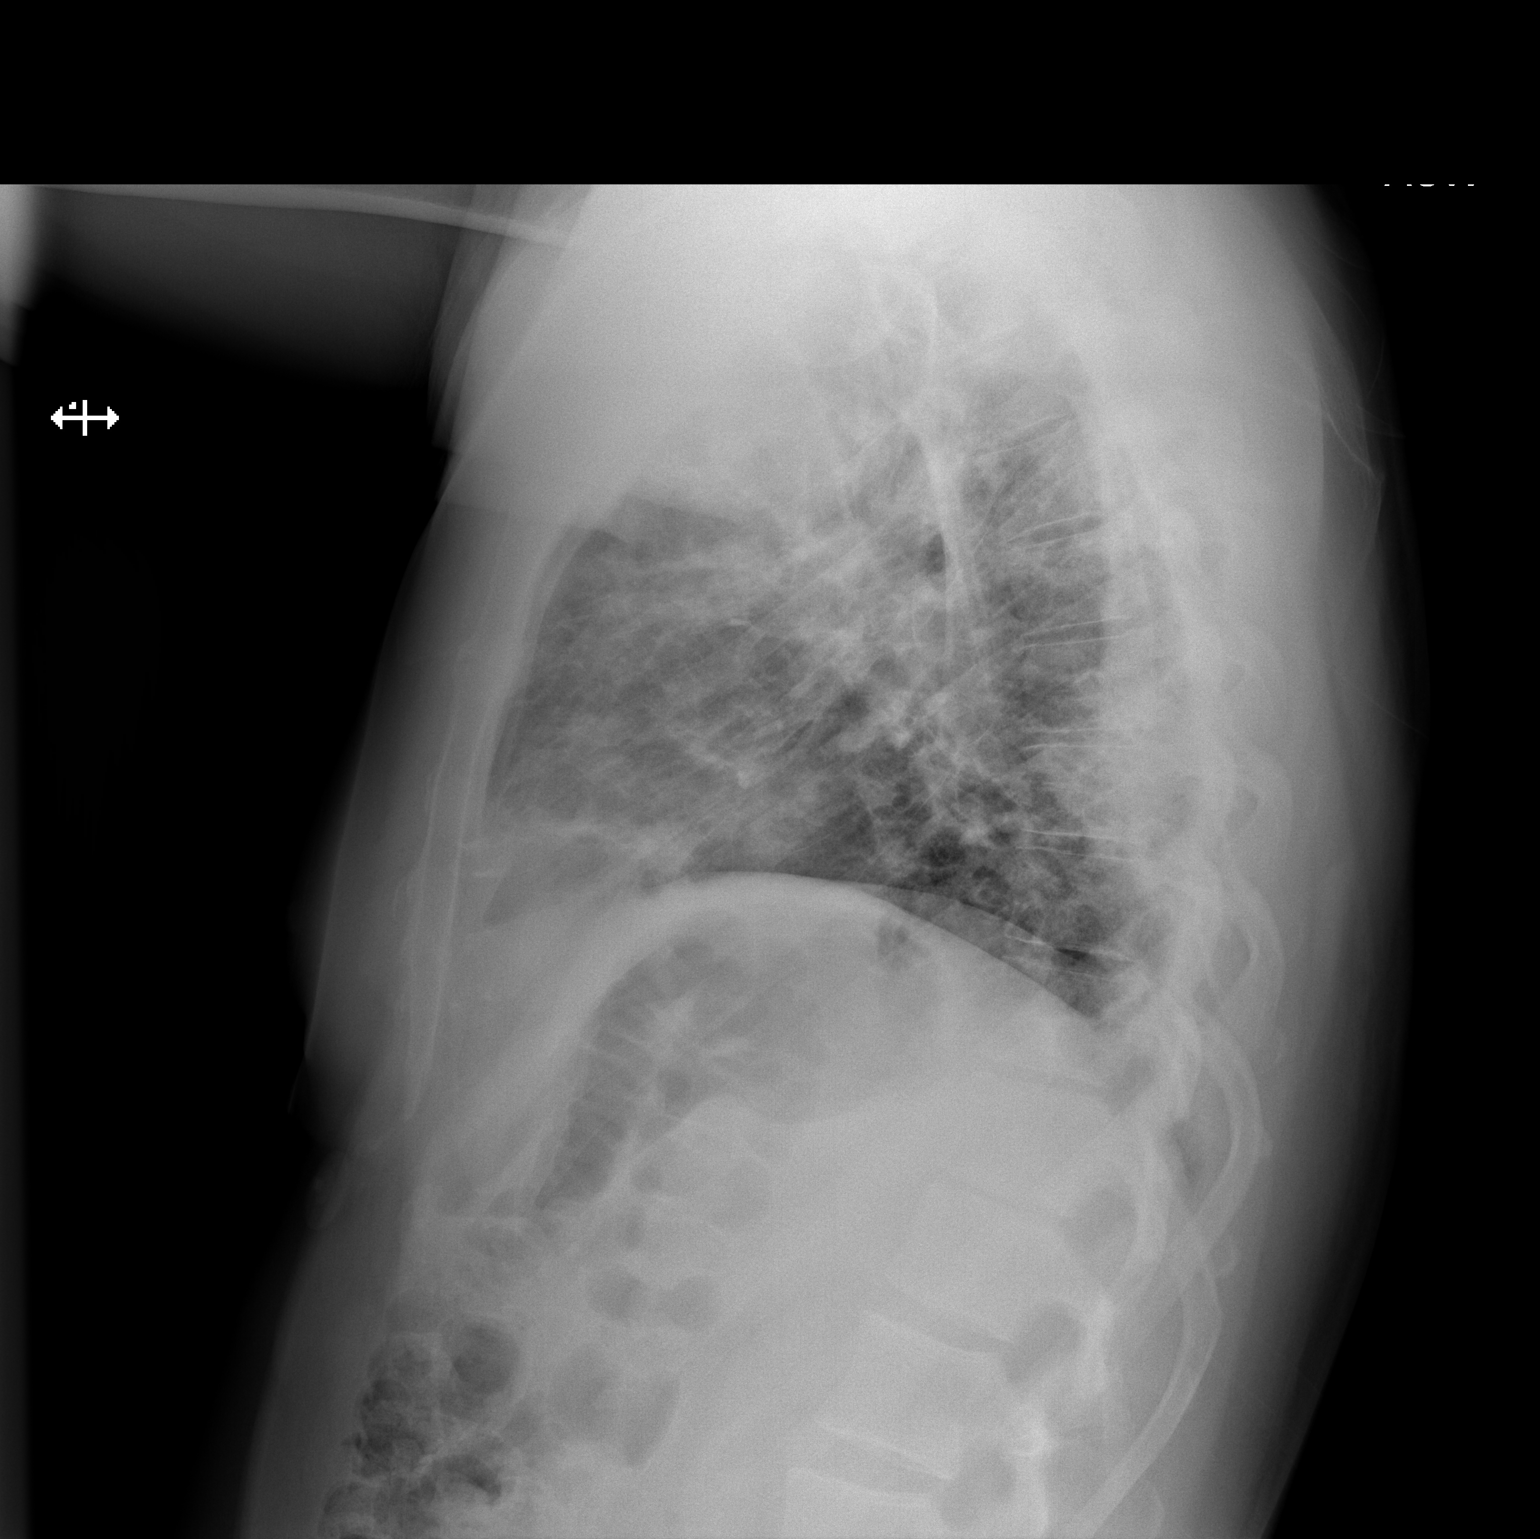

[2 of 2 positions shown; findings below may reference images not displayed]

FINDINGS: Bilateral confluent and streaky densities with peripheral and
subpleural distribution most consistent with multifocal pneumonia,
likely atypical or viral in etiology and in keeping with [id number].
Clinical correlation and follow-up recommended. There is no pleural
effusion or pneumothorax. The cardiac silhouette is within limits.
No acute osseous pathology.
IMPRESSION: Multifocal pneumonia.

## 2023-03-09 ENCOUNTER — Other Ambulatory Visit: Payer: Self-pay | Admitting: Student

## 2023-03-09 DIAGNOSIS — R42 Dizziness and giddiness: Secondary | ICD-10-CM

## 2023-03-23 ENCOUNTER — Ambulatory Visit
Admission: RE | Admit: 2023-03-23 | Discharge: 2023-03-23 | Disposition: A | Discharge status: Home / Self Care | Payer: 59 | Source: Ambulatory Visit | Attending: Student

## 2023-03-23 DIAGNOSIS — R42 Dizziness and giddiness: Secondary | ICD-10-CM

## 2023-03-23 MED ORDER — GADOPICLENOL 0.5 MMOL/ML IV SOLN
10.0000 mL | Freq: Once | INTRAVENOUS | Status: AC | PRN
  Administered 2023-03-23: 10 mL via INTRAVENOUS

## 2023-04-03 ENCOUNTER — Ambulatory Visit: Payer: 59 | Attending: Neurology

## 2023-04-03 DIAGNOSIS — R2681 Unsteadiness on feet: Secondary | ICD-10-CM | POA: Insufficient documentation

## 2023-04-03 DIAGNOSIS — R42 Dizziness and giddiness: Secondary | ICD-10-CM | POA: Insufficient documentation

## 2023-04-03 DIAGNOSIS — R262 Difficulty in walking, not elsewhere classified: Secondary | ICD-10-CM | POA: Insufficient documentation

## 2023-04-03 NOTE — Therapy (Signed)
 OUTPATIENT PHYSICAL THERAPY VESTIBULAR EVALUATION     Patient Name: George [name] MRN: [id number] DOB:1975-03-10, 48 y.o., male Today's Date: 04/04/2023  END OF SESSION:  PT End of Session - 04/04/23 1248     Visit Number 1    Number of Visits 25    Date for PT Re-Evaluation 06/26/23    PT Start Time 1150    PT Stop Time 1230    PT Time Calculation (min) 40 min    Activity Tolerance Patient tolerated treatment well    Behavior During Therapy [facility] for tasks assessed/performed             Past Medical History:  Diagnosis Date   Back pain    Hypercholesterolemia    History reviewed. No pertinent surgical history. There are no active problems to display for this patient.   PCP: Jerl Mina, MD REFERRING PROVIDER: Lonell Face, MD  REFERRING DIAG:   R42 (ICD-10-CM) - Dizziness    THERAPY DIAG:  Dizziness and giddiness  Unsteadiness on feet  ONSET DATE: November 2024  Rationale for Evaluation and Treatment: Rehabilitation  SUBJECTIVE:   SUBJECTIVE STATEMENT:   The pt is a pleasant 48 y/o male presenting to PT for dizziness, reports onset mid November 2024. Pt initially thought he was getting sick, reports was diagnosed with L ear infection, but his R ear was bothering him more. Dizziness continued with no improvement into January. At this time pt prescribed prednisone taper. He does not experience vertigo, his symptoms include a swimmy-headed sensation that started to worsen, he also experiences a bilateral pressure in his head and R ear pain. Pt with negative MRI.  He reports his neuro thinks anxiety could be contributing factor. Pt with recent life stressors with unexpected split from his spouse. He was prescribed diazepam which was initially helpful, but no longer helps.Pt notes feeling unbalanced with gait. Pt will at times use a cane to walk due to chronic LBP. Dizzy symptoms are worse with movement. He reports he stays mostly at home now, not as  active as before. He reports poor sleep 3-6 hours/night. He experiences noise sensitivity but not light or smell sensitivity. Pt has a 61 y/o daughter. On disability due to chronic LBP. Pt reports taking multiple supplements.  Pt accompanied by: self  PERTINENT HISTORY:   Per pt and chart: Ongoing dizziness >3 mo, no clear etiology based on MRI brain and ENT VNG testing eval per neuro note. Symptoms are intermittent, intensity varies, pt experiences head pressure and occ sharp ear pain. No vertigo, but does feel unbalanced, equilibrium is off, worse upon waking and with physical activity. Pt prescribed valium for symptoms. Hx of L sensorineural hearing loss, hx of sinus and ear infections. Pt with chronic LBP (on disability).    PAIN:  Are you having pain?  Chronic LBP  PRECAUTIONS: Fall    WEIGHT BEARING RESTRICTIONS: No  FALLS: Has patient fallen in last 6 months? No  LIVING ENVIRONMENT: Lives with: lives with their family and lives with their daughter   PLOF: Independent  PATIENT GOALS: Get rid of my dizziness   OBJECTIVE:  Note: Objective measures were completed at Evaluation unless otherwise noted.  DIAGNOSTIC FINDINGS:  via chart  03/23/2023 MR BRAIN "IMPRESSION: 1. Unremarkable MRI appearance of the brain. No evidence of an acute intracranial abnormality. 2. No cerebellopontine angle or internal auditory canal mass. 3. No etiology of dizziness is identified.     Electronically Signed   By: Jackey Loge  D.O.   On: 03/23/2023 17:17"  COGNITION: Overall cognitive status: Within functional limits for tasks assessed   POSTURE:  No Significant postural limitations    TRANSFERS: Assistive device utilized: None  Sit to stand: Complete Independence Stand to sit: Complete Independence Chair to chair: Complete Independence   GAIT: Gait pattern:  impaired with scanning/use of head turns Distance walked: clinic distances Assistive device utilized:  None   FUNCTIONAL TESTS:  Dynamic Gait Index: 22/24  PATIENT SURVEYS:  DHI 64 moderate  VESTIBULAR ASSESSMENT:   OCULOMOTOR EXAM:  Ocular Alignment: normal  Ocular ROM: No Limitations  Spontaneous Nystagmus: absent  Gaze-Induced Nystagmus: age appropriate nystagmus at end range  Smooth Pursuits: intact  Saccades: intact  Convergence/Divergence: WNL  VESTIBULAR - OCULAR REFLEX:   Slow VOR: Normal  VOR Cancellation: Normal  Head-Impulse Test: negative bilaterally    Modified Motion Sensitivity Test  Movement Intensity (change from baseline, 0-10, no change=0, severe change=10) Duration  <5 sec = 0  5-10 s = 1 11-20 s = 2 21-30 s = 3 >30 s = 4 Score (Intensity + Duration)  5x Horizontal head turns 2 3 5   5x Vertical head turns 1 3 4   5x Right diagonal head turns (upper left quadrant down to right) 1 2 3   5x Left diagonal head turns (upper right quadrant down to left) 1 0 1  5x Trunk Bends (bending knees reaching to floor) 0 0   5x Right quarter body urns (look over right shoulder with trunk rotation, feet planted) 1 3 4   5x Left quarter body turns (look over left shoulder with trunk rotation, feet planted) 1 2 3   1x 360 degree turn to right 2 3 5   1x 360 degree turn to left 1 1 2   5x VOR cancellation (follow thumbs horizontally with head/trunk rotation x45 degrees each way) 2 3 5   TOTAL SCORE      MSQ = total score x (# of positions)/14   0-10 mild range 11-30 moderate range 31-100 severe range   20.6 moderate                                                                                                                                 TREATMENT DATE: 04/03/23  Self Care / Home management: Education: gradual return to activities within symptom tolerance, once symptoms >2/10 or persisting for 20 min or more will need to modify intensity of activities, be sure to take breaks, hold onto something or sit down, close eyes for a minute or two to allow for symptom  decrease.  TA: Reviewed assessment findings, indications, plan.  PATIENT EDUCATION: Education details: assessment findings, indications, plan, goals Person educated: Patient Education method: Explanation Education comprehension: verbalized understanding  HOME EXERCISE PROGRAM:  GOALS:  GOALS: Goals reviewed with patient? Yes  SHORT TERM GOALS: Target date: 05/15/2023    Patient will be independent in home exercise program to improve balance/dizziness and mobility  for better functional independence with ADLs. Baseline:  Goal status: INITIAL   LONG TERM GOALS: Target date: 06/26/2023   1.  Patient will reduce dizziness handicap inventory score to <50, for less dizziness with ADLs and increased safety with home and work tasks.  Baseline: 64 Goal status: INITIAL  2.  Patient will report at least a 50% improvement in activity/movement provoked dizziness symptoms.  Baseline:  currently limiting usual activities due to symptoms Goal status: INITIAL  3.  Patient will increase dynamic gait index score to 24/24 as to demonstrate improved dynamic balance and ability to utilize head turns during gait for better safety with community/home ambulation.   Baseline: 22 Goal status: INITIAL  4.   Pt will demonstrate a score <10 on the MMS test to indicate improvement in motion sensitivity to mild. Baseline:  20.6 Goal status: INITIAL   ASSESSMENT:  CLINICAL IMPRESSION: Patient is a pleasant 48 y.o. male who was seen today for physical therapy evaluation and treatment for dizziness and unsteadiness. Exam findings indicate normal oculomotor exam (positional testing deferred), but impairment in gait with scanning per DGI. Pt also with moderate motion sensitivity impairment per MMS test, where increase in dizziness symptoms often persisted >20 seconds. DHI score indicates moderate perception of handicap due to symptoms. Dizziness is currently significantly impacting pt where he reports  limitation in completing usual activities. The pt will benefit from further skilled PT to improve impairments in order to decrease dizziness, increase QOL and return to PLOF.   OBJECTIVE IMPAIRMENTS: Abnormal gait, decreased balance, decreased coordination, decreased mobility, difficulty walking, dizziness, improper body mechanics, and pain.   ACTIVITY LIMITATIONS: lifting, bending, stairs, bed mobility, and locomotion level  PARTICIPATION LIMITATIONS: shopping, community activity, and yard work  PERSONAL FACTORS: Fitness, Past/current experiences, Time since onset of injury/illness/exacerbation, and 1 comorbidity: chronic LBP  are also affecting patient's functional outcome.   REHAB POTENTIAL: Good  CLINICAL DECISION MAKING: Evolving/moderate complexity  EVALUATION COMPLEXITY: Moderate   PLAN:  PT FREQUENCY: 1-2x/week  PT DURATION: 12 weeks  PLANNED INTERVENTIONS: 97164- PT Re-evaluation, 97110-Therapeutic exercises, 97530- Therapeutic activity, [id number]- Neuromuscular re-education, 97535- Self Care, 16109- Manual therapy, [phone]- Gait training, [phone]- Orthotic Fit/training, [phone]- Canalith repositioning, [id number]- Electrical stimulation (manual), [phone]- Traction (mechanical), Patient/Family education, Balance training, Stair training, Dry Needling, Joint mobilization, Spinal mobilization, Vestibular training, DME instructions, Cryotherapy, and Moist heat  PLAN FOR NEXT SESSION: vestibular goggles next visit for further screen out hypofunction   Baird Kay, PT 04/04/2023, 4:21 PM

## 2023-04-05 ENCOUNTER — Ambulatory Visit: Payer: 59

## 2023-04-05 DIAGNOSIS — R42 Dizziness and giddiness: Secondary | ICD-10-CM | POA: Diagnosis not present

## 2023-04-05 DIAGNOSIS — R262 Difficulty in walking, not elsewhere classified: Secondary | ICD-10-CM

## 2023-04-05 DIAGNOSIS — R2681 Unsteadiness on feet: Secondary | ICD-10-CM
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
## 2023-04-10 ENCOUNTER — Ambulatory Visit: Payer: 59 | Attending: Neurology

## 2023-04-10 DIAGNOSIS — R262 Difficulty in walking, not elsewhere classified: Secondary | ICD-10-CM | POA: Diagnosis present

## 2023-04-10 DIAGNOSIS — R42 Dizziness and giddiness: Secondary | ICD-10-CM | POA: Diagnosis present

## 2023-04-10 DIAGNOSIS — R2681 Unsteadiness on feet: Secondary | ICD-10-CM | POA: Insufficient documentation

## 2023-04-10 DIAGNOSIS — M5459 Other low back pain: Secondary | ICD-10-CM | POA: Diagnosis present

## 2023-04-10 NOTE — Therapy (Signed)
 OUTPATIENT PHYSICAL THERAPY VESTIBULAR TREATMENT     Patient Name: George [name] MRN: [id number] DOB:January 26, 1976, 48 y.o., male Today's Date: 04/10/2023  END OF SESSION:  PT End of Session - 04/10/23 1459     Visit Number 3    Number of Visits 25    Date for PT Re-Evaluation 06/26/23    PT Start Time 1325    PT Stop Time 1400    PT Time Calculation (min) 35 min    Activity Tolerance Patient tolerated treatment well    Behavior During Therapy [facility] for tasks assessed/performed              Past Medical History:  Diagnosis Date   Back pain    Hypercholesterolemia    History reviewed. No pertinent surgical history. There are no active problems to display for this patient.   PCP: Jerl Mina, MD REFERRING PROVIDER: Lonell Face, MD  REFERRING DIAG:   R42 (ICD-10-CM) - Dizziness    THERAPY DIAG:  Dizziness and giddiness  Unsteadiness on feet  Difficulty in walking, not elsewhere classified  ONSET DATE: November 2024  Rationale for Evaluation and Treatment: Rehabilitation  SUBJECTIVE:   SUBJECTIVE STATEMENT:  Pt reports yesterday was a bad day. His head was bothering him; he continues to experience head pressure. He feels a lot of pressure build-up if he lays on his side. Despite pressure sensation, pt reports he was able to go for a walk every single day except for yesterday (1.1 miles). Pt took acetaminophen this morning, which has helped symptoms. Pt with bad pressure and pain in both ears yesterday, ranges 8/10. Baseline currently 3/10.   Pt accompanied by: self  PERTINENT HISTORY:   From eval:  The pt is a pleasant 48 y/o male presenting to PT for dizziness, reports onset mid November 2024. Pt initially thought he was getting sick, reports was diagnosed with L ear infection, but his R ear was bothering him more. Dizziness continued with no improvement into January. At this time pt prescribed prednisone taper. He does not experience vertigo,  his symptoms include a swimmy-headed sensation that started to worsen, he also experiences a bilateral pressure in his head and R ear pain. Pt with negative MRI.  He reports his neuro thinks anxiety could be contributing factor. Pt with recent life stressors with unexpected split from his spouse. He was prescribed diazepam which was initially helpful, but no longer helps.Pt notes feeling unbalanced with gait. Pt will at times use a cane to walk due to chronic LBP. Dizzy symptoms are worse with movement. He reports he stays mostly at home now, not as active as before. He reports poor sleep 3-6 hours/night. He experiences noise sensitivity but not light or smell sensitivity. Pt has a 33 y/o daughter. On disability due to chronic LBP. Pt reports taking multiple supplements.  Per pt and chart: Ongoing dizziness >3 mo, no clear etiology based on MRI brain and ENT VNG testing eval per neuro note. Symptoms are intermittent, intensity varies, pt experiences head pressure and occ sharp ear pain. No vertigo, but does feel unbalanced, equilibrium is off, worse upon waking and with physical activity. Pt prescribed valium for symptoms. Hx of L sensorineural hearing loss, hx of sinus and ear infections. Pt with chronic LBP (on disability).    PAIN:  Are you having pain?  Chronic LBP  PRECAUTIONS: Fall    WEIGHT BEARING RESTRICTIONS: No  FALLS: Has patient fallen in last 6 months? No  LIVING ENVIRONMENT: Lives with:  lives with their family and lives with their daughter   PLOF: Independent  PATIENT GOALS: Get rid of my dizziness   OBJECTIVE:  Note: Objective measures were completed at Evaluation unless otherwise noted.  DIAGNOSTIC FINDINGS:  via chart  03/23/2023 MR BRAIN "IMPRESSION: 1. Unremarkable MRI appearance of the brain. No evidence of an acute intracranial abnormality. 2. No cerebellopontine angle or internal auditory canal mass. 3. No etiology of dizziness is identified.      Electronically Signed   By: Jackey Loge D.O.   On: 03/23/2023 17:17"  COGNITION: Overall cognitive status: Within functional limits for tasks assessed   POSTURE:  No Significant postural limitations    TRANSFERS: Assistive device utilized: None  Sit to stand: Complete Independence Stand to sit: Complete Independence Chair to chair: Complete Independence   GAIT: Gait pattern:  impaired with scanning/use of head turns Distance walked: clinic distances Assistive device utilized: None   FUNCTIONAL TESTS:  Dynamic Gait Index: 22/24  PATIENT SURVEYS:  DHI 64 moderate  VESTIBULAR ASSESSMENT:   OCULOMOTOR EXAM:  Ocular Alignment: normal  Ocular ROM: No Limitations  Spontaneous Nystagmus: absent  Gaze-Induced Nystagmus: age appropriate nystagmus at end range  Smooth Pursuits: intact  Saccades: intact  Convergence/Divergence: WNL  VESTIBULAR - OCULAR REFLEX:   Slow VOR: Normal  VOR Cancellation: Normal  Head-Impulse Test: negative bilaterally    Modified Motion Sensitivity Test  Movement Intensity (change from baseline, 0-10, no change=0, severe change=10) Duration  <5 sec = 0  5-10 s = 1 11-20 s = 2 21-30 s = 3 >30 s = 4 Score (Intensity + Duration)  5x Horizontal head turns 2 3 5   5x Vertical head turns 1 3 4   5x Right diagonal head turns (upper left quadrant down to right) 1 2 3   5x Left diagonal head turns (upper right quadrant down to left) 1 0 1  5x Trunk Bends (bending knees reaching to floor) 0 0   5x Right quarter body urns (look over right shoulder with trunk rotation, feet planted) 1 3 4   5x Left quarter body turns (look over left shoulder with trunk rotation, feet planted) 1 2 3   1x 360 degree turn to right 2 3 5   1x 360 degree turn to left 1 1 2   5x VOR cancellation (follow thumbs horizontally with head/trunk rotation x45 degrees each way) 2 3 5   TOTAL SCORE      MSQ = total score x (# of positions)/14   0-10 mild range 11-30 moderate  range 31-100 severe range   20.6 moderate   04/05/23: Vestibular goggles: Horizontal head shake test: negaitve Pressure test: negative  DH - negative bilat                                                                                                                              TREATMENT DATE: 04/05/23  TE: Cervical ROM-  completed to rule out as contributing to pressure sensation  Cervical lat flexion: L 60 deg, R 55 deg Ext 55 deg Flex 60 deg Rotation: 70-75 bilat  Comments: no pain throughout  NMR: 360 deg turns against wall with UE support - most difficulty to L side 2-5 turns (5 turns difficult), 2-4 bouts each way  Habituation: Seated bend nose to L knee with sitting up looking to R 2x8 with decreased speed- mild pressure symptoms  Seated head tilt/nod slow speed 5x Seated vertical head turn 10x Standing vertical head turns WBOS and NBOS 10x for each  VOR cancellation horizontal 10x   Several minutes of the following: Gait with horizontal visual tracking of ball (toss with PT) to R and L  Gait with vertical ball toss  Self-care/home management: Discussed lifestyle factors for managing symptoms. Encourage follow up with PCP regarding supplements pt takes & current med list. Always follow your physicians instructions regarding medications, supplements    Education details: exercise technique Person educated: Patient Education method: Explanation Education comprehension: verbalized understanding  HOME EXERCISE PROGRAM:  GOALS:  GOALS: Goals reviewed with patient? Yes  SHORT TERM GOALS: Target date: 05/15/2023    Patient will be independent in home exercise program to improve balance/dizziness and mobility for better functional independence with ADLs. Baseline:  Goal status: INITIAL   LONG TERM GOALS: Target date: 06/26/2023   1.  Patient will reduce dizziness handicap inventory score to <50, for less dizziness with ADLs and increased safety with home  and work tasks.  Baseline: 64 Goal status: INITIAL  2.  Patient will report at least a 50% improvement in activity/movement provoked dizziness symptoms.  Baseline:  currently limiting usual activities due to symptoms Goal status: INITIAL  3.  Patient will increase dynamic gait index score to 24/24 as to demonstrate improved dynamic balance and ability to utilize head turns during gait for better safety with community/home ambulation.   Baseline: 22 Goal status: INITIAL  4.   Pt will demonstrate a score <10 on the MMS test to indicate improvement in motion sensitivity to mild. Baseline:  20.6 Goal status: INITIAL   ASSESSMENT:  CLINICAL IMPRESSION: Pt continues to report head pressure as a primary symptom, and symptoms were mild-moderate with majority of interventions, turning was most challenging. PT did encourage pt to follow-up with his physician regarding supplements he is taking, medications, as pt reports taking several supplements. Plan next visit to add UE/LE strengthening to vestibular conditioning program. The pt will benefit from further skilled PT to improve impairments in order to decrease dizziness, increase QOL and return to PLOF.   OBJECTIVE IMPAIRMENTS: Abnormal gait, decreased balance, decreased coordination, decreased mobility, difficulty walking, dizziness, improper body mechanics, and pain.   ACTIVITY LIMITATIONS: lifting, bending, stairs, bed mobility, and locomotion level  PARTICIPATION LIMITATIONS: shopping, community activity, and yard work  PERSONAL FACTORS: Fitness, Past/current experiences, Time since onset of injury/illness/exacerbation, and 1 comorbidity: chronic LBP  are also affecting patient's functional outcome.   REHAB POTENTIAL: Good  CLINICAL DECISION MAKING: Evolving/moderate complexity  EVALUATION COMPLEXITY: Moderate   PLAN:  PT FREQUENCY: 1-2x/week  PT DURATION: 12 weeks  PLANNED INTERVENTIONS: 97164- PT Re-evaluation,  97110-Therapeutic exercises, 97530- Therapeutic activity, [id number]- Neuromuscular re-education, 97535- Self Care, 09811- Manual therapy, [id number]- Gait training, [phone]- Orthotic Fit/training, [id number]- Canalith repositioning, [id number]- Electrical stimulation (manual), [id number]- Traction (mechanical), Patient/Family education, Balance training, Stair training, Dry Needling, Joint mobilization, Spinal mobilization, Vestibular training, DME instructions, Cryotherapy, and Moist heat  PLAN FOR NEXT SESSION: continue habituation interventions, strength training around pain UE and LE  Baird Kay, PT 04/10/2023, 3:07 PM

## 2023-04-12 ENCOUNTER — Ambulatory Visit: Payer: 59

## 2023-04-12 DIAGNOSIS — R42 Dizziness and giddiness: Secondary | ICD-10-CM | POA: Diagnosis not present

## 2023-04-12 NOTE — Therapy (Signed)
 OUTPATIENT PHYSICAL THERAPY VESTIBULAR TREATMENT     Patient Name: George [name] MRN: [id number] DOB:[birth year], 48 y.o., male Today's Date: 04/12/2023  END OF SESSION:  PT End of Session - 04/12/23 1314     Visit Number 4    Number of Visits 25    Date for PT Re-Evaluation 06/26/23    PT Start Time 1317    PT Stop Time 1356    PT Time Calculation (min) 39 min    Activity Tolerance Patient tolerated treatment well    Behavior During Therapy WFL for tasks assessed/performed              Past Medical History:  Diagnosis Date   Back pain    Hypercholesterolemia    History reviewed. No pertinent surgical history. There are no active problems to display for this patient.   PCP: Jerl Mina, MD REFERRING PROVIDER: Lonell Face, MD  REFERRING DIAG:   R42 (ICD-10-CM) - Dizziness    THERAPY DIAG:  Dizziness and giddiness  ONSET DATE: November 2024  Rationale for Evaluation and Treatment: Rehabilitation  SUBJECTIVE:   SUBJECTIVE STATEMENT:  Pt reports going for the walk the other day and noted sensation of pressure build-up in his head following. This lasted for about thirty minutes. Feels his cardio is not very good. Pt has appt to see eye dr next Friday.    Pt accompanied by: self  PERTINENT HISTORY:   From eval:  The pt is a pleasant 48 y/o male presenting to PT for dizziness, reports onset mid November 2024. Pt initially thought he was getting sick, reports was diagnosed with L ear infection, but his R ear was bothering him more. Dizziness continued with no improvement into January. At this time pt prescribed prednisone taper. He does not experience vertigo, his symptoms include a swimmy-headed sensation that started to worsen, he also experiences a bilateral pressure in his head and R ear pain. Pt with negative MRI.  He reports his neuro thinks anxiety could be contributing factor. Pt with recent life stressors with unexpected split from his  spouse. He was prescribed diazepam which was initially helpful, but no longer helps.Pt notes feeling unbalanced with gait. Pt will at times use a cane to walk due to chronic LBP. Dizzy symptoms are worse with movement. He reports he stays mostly at home now, not as active as before. He reports poor sleep 3-6 hours/night. He experiences noise sensitivity but not light or smell sensitivity. Pt has a 54 y/o daughter. On disability due to chronic LBP. Pt reports taking multiple supplements.  Per pt and chart: Ongoing dizziness >3 mo, no clear etiology based on MRI brain and ENT VNG testing eval per neuro note. Symptoms are intermittent, intensity varies, pt experiences head pressure and occ sharp ear pain. No vertigo, but does feel unbalanced, equilibrium is off, worse upon waking and with physical activity. Pt prescribed valium for symptoms. Hx of L sensorineural hearing loss, hx of sinus and ear infections. Pt with chronic LBP (on disability).    PAIN:  Are you having pain?  Chronic LBP  PRECAUTIONS: Fall    WEIGHT BEARING RESTRICTIONS: No  FALLS: Has patient fallen in last 6 months? No  LIVING ENVIRONMENT: Lives with: lives with their family and lives with their daughter   PLOF: Independent  PATIENT GOALS: Get rid of my dizziness   OBJECTIVE:  Note: Objective measures were completed at Evaluation unless otherwise noted.  DIAGNOSTIC FINDINGS:  via chart  03/23/2023 MR BRAIN "  IMPRESSION: 1. Unremarkable MRI appearance of the brain. No evidence of an acute intracranial abnormality. 2. No cerebellopontine angle or internal auditory canal mass. 3. No etiology of dizziness is identified.     Electronically Signed   By: Jackey Loge D.O.   On: 03/23/2023 17:17"  COGNITION: Overall cognitive status: Within functional limits for tasks assessed   POSTURE:  No Significant postural limitations    TRANSFERS: Assistive device utilized: None  Sit to stand: Complete  Independence Stand to sit: Complete Independence Chair to chair: Complete Independence   GAIT: Gait pattern:  impaired with scanning/use of head turns Distance walked: clinic distances Assistive device utilized: None   FUNCTIONAL TESTS:  Dynamic Gait Index: 22/24  PATIENT SURVEYS:  DHI 64 moderate  VESTIBULAR ASSESSMENT:   OCULOMOTOR EXAM:  Ocular Alignment: normal  Ocular ROM: No Limitations  Spontaneous Nystagmus: absent  Gaze-Induced Nystagmus: age appropriate nystagmus at end range  Smooth Pursuits: intact  Saccades: intact  Convergence/Divergence: WNL  VESTIBULAR - OCULAR REFLEX:   Slow VOR: Normal  VOR Cancellation: Normal  Head-Impulse Test: negative bilaterally    Modified Motion Sensitivity Test  Movement Intensity (change from baseline, 0-10, no change=0, severe change=10) Duration  <5 sec = 0  5-10 s = 1 11-20 s = 2 21-30 s = 3 >30 s = 4 Score (Intensity + Duration)  5x Horizontal head turns 2 3 5   5x Vertical head turns 1 3 4   5x Right diagonal head turns (upper left quadrant down to right) 1 2 3   5x Left diagonal head turns (upper right quadrant down to left) 1 0 1  5x Trunk Bends (bending knees reaching to floor) 0 0   5x Right quarter body urns (look over right shoulder with trunk rotation, feet planted) 1 3 4   5x Left quarter body turns (look over left shoulder with trunk rotation, feet planted) 1 2 3   1x 360 degree turn to right 2 3 5   1x 360 degree turn to left 1 1 2   5x VOR cancellation (follow thumbs horizontally with head/trunk rotation x45 degrees each way) 2 3 5   TOTAL SCORE      MSQ = total score x (# of positions)/14   0-10 mild range 11-30 moderate range 31-100 severe range   20.6 moderate   04/05/23: Vestibular goggles: Horizontal head shake test: negaitve Pressure test: negative  DH - negative bilat                                                                                                                               TREATMENT DATE: 04/05/23  NMR:  Habituation: Seated bend nose to L knee with sitting up looking to R 2x12 with decreased speed- mildly dizzy   Seated head tilt/nod slow speed 1x10  Seated horizontal head turn 10x  Seated eyes closed vertical head turn 10x - about the same level of symptoms with eyes closed .  Gait with vertical  head turns 3x10 meters - every 3 steps - mild symptoms   Gait with horizontal head turns 3x10 meters - every 3 steps - mild symptoms  360 deg turns against wall with UE support - about 5 turns at wall to R and L - moderate symptoms   TA: Active recovery interventions- Air squats 10x, 20x - some knee discomfort but otherwise no pain  Standing rows 12.5# 10x, 22.5# 2x10 Walking lunges 2x8 length of bars   Wall push-ups 20x   Pt notes slight headache following interventions - further interventions discontinued   Self-Care/Home management:  Discussed technique if feeling really dizzy while out shopping:  -first look with your eyes, then turn your head (slowly)  -Hold onto a cart or a surface and find something steady to look at for a couple minutes (e.g. a cereal box)     Education details: exercise technique, HEP update Person educated: Patient Education method: Explanation, demo, vc, printout  Education comprehension: verbalized understanding  HOME EXERCISE PROGRAM:  Added: Access Code: [email or web address] URL: [email or web address] Date: 04/12/2023 Prepared by: Temple Pacini  Exercises - Walking Forward Lunge  - 1 x daily - 3-5 x weekly - 2 sets - 10-20 reps  GOALS:  GOALS: Goals reviewed with patient? Yes  SHORT TERM GOALS: Target date: 05/15/2023    Patient will be independent in home exercise program to improve balance/dizziness and mobility for better functional independence with ADLs. Baseline:  Goal status: INITIAL   LONG TERM GOALS: Target date: 06/26/2023   1.  Patient will reduce dizziness handicap inventory score to  <50, for less dizziness with ADLs and increased safety with home and work tasks.  Baseline: 64 Goal status: INITIAL  2.  Patient will report at least a 50% improvement in activity/movement provoked dizziness symptoms.  Baseline:  currently limiting usual activities due to symptoms Goal status: INITIAL  3.  Patient will increase dynamic gait index score to 24/24 as to demonstrate improved dynamic balance and ability to utilize head turns during gait for better safety with community/home ambulation.   Baseline: 22 Goal status: INITIAL  4.   Pt will demonstrate a score <10 on the MMS test to indicate improvement in motion sensitivity to mild. Baseline:  20.6 Goal status: INITIAL    ASSESSMENT:  CLINICAL IMPRESSION: Pt dizzy symptoms mild-mod throughout, and he did note a headache following some interventions, so further vestibular exercises discontinued as a result. Reviewed strategies to use when highly symptomatic. HEP updated. The pt will benefit from further skilled PT to improve impairments in order to decrease dizziness, increase QOL and return to PLOF.   OBJECTIVE IMPAIRMENTS: Abnormal gait, decreased balance, decreased coordination, decreased mobility, difficulty walking, dizziness, improper body mechanics, and pain.   ACTIVITY LIMITATIONS: lifting, bending, stairs, bed mobility, and locomotion level  PARTICIPATION LIMITATIONS: shopping, community activity, and yard work  PERSONAL FACTORS: Fitness, Past/current experiences, Time since onset of injury/illness/exacerbation, and 1 comorbidity: chronic LBP  are also affecting patient's functional outcome.   REHAB POTENTIAL: Good  CLINICAL DECISION MAKING: Evolving/moderate complexity  EVALUATION COMPLEXITY: Moderate   PLAN:  PT FREQUENCY: 1-2x/week  PT DURATION: 12 weeks  PLANNED INTERVENTIONS: 97164- PT Re-evaluation, 97110-Therapeutic exercises, 97530- Therapeutic activity, [id number]- Neuromuscular re-education, 97535-  Self Care, 95284- Manual therapy, [id number]- Gait training, [phone]- Orthotic Fit/training, [phone]- Canalith repositioning, [id number]- Electrical stimulation (manual), [id number]- Traction (mechanical), Patient/Family education, Balance training, Stair training, Dry Needling, Joint mobilization, Spinal mobilization, Vestibular training, DME instructions, Cryotherapy, and Moist heat  PLAN FOR NEXT SESSION:  continue habituation interventions, strength training around pain UE and LE    Baird Kay, PT 04/12/2023, 2:03 PM

## 2023-04-17 ENCOUNTER — Ambulatory Visit: Payer: 59

## 2023-04-19 ENCOUNTER — Ambulatory Visit: Payer: 59

## 2023-04-19 DIAGNOSIS — R42 Dizziness and giddiness: Secondary | ICD-10-CM

## 2023-04-19 NOTE — Therapy (Signed)
 OUTPATIENT PHYSICAL THERAPY VESTIBULAR TREATMENT     Patient Name: George [name] MRN: [id number] DOB:1975-08-09, 48 y.o., male Today's Date: 04/19/2023  END OF SESSION:     Past Medical History:  Diagnosis Date   Back pain    Hypercholesterolemia    No past surgical history on file. There are no active problems to display for this patient.   PCP: Jerl Mina, MD REFERRING PROVIDER: Lonell Face, MD  REFERRING DIAG:   R42 (ICD-10-CM) - Dizziness    THERAPY DIAG:  No diagnosis found.  ONSET DATE: November 2024  Rationale for Evaluation and Treatment: Rehabilitation  SUBJECTIVE:   SUBJECTIVE STATEMENT:  Pt reports only 2 hrs of sleep last night. Has not been sleeping well. He does note his dizziness seems like it's getting better. He reports dealing with personal stressors.  Pt saw a counselor on Monday, has psychiatry appt this coming Monday. Pt has been walking almost every day. He has noticed looking at his phone and writing something down can make him moderately dizzy.  Pt reports he hasn't felt head pressure in a week. Now has turned into regular headache.  Pt accompanied by: self  PERTINENT HISTORY:   From eval:  The pt is a pleasant 48 y/o male presenting to PT for dizziness, reports onset mid November 2024. Pt initially thought he was getting sick, reports was diagnosed with L ear infection, but his R ear was bothering him more. Dizziness continued with no improvement into January. At this time pt prescribed prednisone taper. He does not experience vertigo, his symptoms include a swimmy-headed sensation that started to worsen, he also experiences a bilateral pressure in his head and R ear pain. Pt with negative MRI.  He reports his neuro thinks anxiety could be contributing factor. Pt with recent life stressors with unexpected split from his spouse. He was prescribed diazepam which was initially helpful, but no longer helps.Pt notes feeling  unbalanced with gait. Pt will at times use a cane to walk due to chronic LBP. Dizzy symptoms are worse with movement. He reports he stays mostly at home now, not as active as before. He reports poor sleep 3-6 hours/night. He experiences noise sensitivity but not light or smell sensitivity. Pt has a 63 y/o daughter. On disability due to chronic LBP. Pt reports taking multiple supplements.  Per pt and chart: Ongoing dizziness >3 mo, no clear etiology based on MRI brain and ENT VNG testing eval per neuro note. Symptoms are intermittent, intensity varies, pt experiences head pressure and occ sharp ear pain. No vertigo, but does feel unbalanced, equilibrium is off, worse upon waking and with physical activity. Pt prescribed valium for symptoms. Hx of L sensorineural hearing loss, hx of sinus and ear infections. Pt with chronic LBP (on disability).    PAIN:  Are you having pain?  Chronic LBP  PRECAUTIONS: Fall    WEIGHT BEARING RESTRICTIONS: No  FALLS: Has patient fallen in last 6 months? No  LIVING ENVIRONMENT: Lives with: lives with their family and lives with their daughter   PLOF: Independent  PATIENT GOALS: Get rid of my dizziness   OBJECTIVE:  Note: Objective measures were completed at Evaluation unless otherwise noted.  DIAGNOSTIC FINDINGS:  via chart  03/23/2023 MR BRAIN "IMPRESSION: 1. Unremarkable MRI appearance of the brain. No evidence of an acute intracranial abnormality. 2. No cerebellopontine angle or internal auditory canal mass. 3. No etiology of dizziness is identified.     Electronically Signed   By: Ronaldo Miyamoto  Renette Butters D.O.   On: 03/23/2023 17:17"  COGNITION: Overall cognitive status: Within functional limits for tasks assessed   POSTURE:  No Significant postural limitations    TRANSFERS: Assistive device utilized: None  Sit to stand: Complete Independence Stand to sit: Complete Independence Chair to chair: Complete Independence   GAIT: Gait pattern:   impaired with scanning/use of head turns Distance walked: clinic distances Assistive device utilized: None   FUNCTIONAL TESTS:  Dynamic Gait Index: 22/24  PATIENT SURVEYS:  DHI 64 moderate  VESTIBULAR ASSESSMENT:   OCULOMOTOR EXAM:  Ocular Alignment: normal  Ocular ROM: No Limitations  Spontaneous Nystagmus: absent  Gaze-Induced Nystagmus: age appropriate nystagmus at end range  Smooth Pursuits: intact  Saccades: intact  Convergence/Divergence: WNL  VESTIBULAR - OCULAR REFLEX:   Slow VOR: Normal  VOR Cancellation: Normal  Head-Impulse Test: negative bilaterally    Modified Motion Sensitivity Test  Movement Intensity (change from baseline, 0-10, no change=0, severe change=10) Duration  <5 sec = 0  5-10 s = 1 11-20 s = 2 21-30 s = 3 >30 s = 4 Score (Intensity + Duration)  5x Horizontal head turns 2 3 5   5x Vertical head turns 1 3 4   5x Right diagonal head turns (upper left quadrant down to right) 1 2 3   5x Left diagonal head turns (upper right quadrant down to left) 1 0 1  5x Trunk Bends (bending knees reaching to floor) 0 0   5x Right quarter body urns (look over right shoulder with trunk rotation, feet planted) 1 3 4   5x Left quarter body turns (look over left shoulder with trunk rotation, feet planted) 1 2 3   1x 360 degree turn to right 2 3 5   1x 360 degree turn to left 1 1 2   5x VOR cancellation (follow thumbs horizontally with head/trunk rotation x45 degrees each way) 2 3 5   TOTAL SCORE      MSQ = total score x (# of positions)/14   0-10 mild range 11-30 moderate range 31-100 severe range   20.6 moderate   04/05/23: Vestibular goggles: Horizontal head shake test: negaitve Pressure test: negative  DH - negative bilat                                                                                                                              TREATMENT DATE: 04/05/23  NMR:  [place]oreBALANCE - tux racer x 3 - no dizziness, no significant balance difficulties.  Able to complete mostly with UE support  Habituation: Seated bend nose to L knee with sitting up looking to R 1x12 mildly dizzy    Seated VOR cancellation horizontal 10x - no dizziness --repeats in standing 10x - very slightly dizzy   Two target VOR while standing on airex/reading letters - no dizziness no LOB  Seated bend nose to L knee with sitting up 2x12 - mildly dizzy    Seated bend nose to R knee with sitting up 12x -  mildly dizzy   360 turn to R and L sides 4x each way  - rates symptoms 2/10   Stand head tilt/nod slow speed 1x10 - increases symptoms to 3/10   TA: Leg press 25# 10x Leg press 55# 15x calf press 15x Leg press 75# 15x calf press 15x  Airex squats 20x   Self-Care/Home management:  Recommendation to try a 15-20 min free online yoga or piliates class, as long as it feel OK for your back. This will help with conditioning and establishing a more active routine.    Education details: exercise technique,  activity recommendations  Person educated: Patient Education method: Explanation, demo, vc,  Education comprehension: verbalized understanding  HOME EXERCISE PROGRAM:  Added: Access Code: [email or web address] URL: [email or web address] Date: 04/12/2023 Prepared by: Temple Pacini  Exercises - Walking Forward Lunge  - 1 x daily - 3-5 x weekly - 2 sets - 10-20 reps  GOALS:  GOALS: Goals reviewed with patient? Yes  SHORT TERM GOALS: Target date: 05/15/2023    Patient will be independent in home exercise program to improve balance/dizziness and mobility for better functional independence with ADLs. Baseline:  Goal status: INITIAL   LONG TERM GOALS: Target date: 06/26/2023   1.  Patient will reduce dizziness handicap inventory score to <50, for less dizziness with ADLs and increased safety with home and work tasks.  Baseline: 64 Goal status: INITIAL  2.  Patient will report at least a 50% improvement in activity/movement provoked dizziness  symptoms.  Baseline:  currently limiting usual activities due to symptoms Goal status: INITIAL  3.  Patient will increase dynamic gait index score to 24/24 as to demonstrate improved dynamic balance and ability to utilize head turns during gait for better safety with community/home ambulation.   Baseline: 22 Goal status: INITIAL  4.   Pt will demonstrate a score <10 on the MMS test to indicate improvement in motion sensitivity to mild. Baseline:  20.6 Goal status: INITIAL    ASSESSMENT:  CLINICAL IMPRESSION: Pt presents with reports of improvement in some of his vestibular symptoms over the past week. Majority of habituation in session continues to make him mild-mod dizzy. Discussed benefits of starting a home exercise program such as yoga or piliates to promote activity and conditioning. The pt will benefit from further skilled PT to improve impairments in order to decrease dizziness, increase QOL and return to PLOF.   OBJECTIVE IMPAIRMENTS: Abnormal gait, decreased balance, decreased coordination, decreased mobility, difficulty walking, dizziness, improper body mechanics, and pain.   ACTIVITY LIMITATIONS: lifting, bending, stairs, bed mobility, and locomotion level  PARTICIPATION LIMITATIONS: shopping, community activity, and yard work  PERSONAL FACTORS: Fitness, Past/current experiences, Time since onset of injury/illness/exacerbation, and 1 comorbidity: chronic LBP  are also affecting patient's functional outcome.   REHAB POTENTIAL: Good  CLINICAL DECISION MAKING: Evolving/moderate complexity  EVALUATION COMPLEXITY: Moderate   PLAN:  PT FREQUENCY: 1-2x/week  PT DURATION: 12 weeks  PLANNED INTERVENTIONS: 97164- PT Re-evaluation, 97110-Therapeutic exercises, 97530- Therapeutic activity, [id number]- Neuromuscular re-education, 97535- Self Care, 16109- Manual therapy, [id number]- Gait training, [id number]- Orthotic Fit/training, [phone]- Canalith repositioning, [id number]- Electrical stimulation  (manual), [phone]- Traction (mechanical), Patient/Family education, Balance training, Stair training, Dry Needling, Joint mobilization, Spinal mobilization, Vestibular training, DME instructions, Cryotherapy, and Moist heat  PLAN FOR NEXT SESSION: continue habituation interventions, strength training around pain UE and LE    Baird Kay, PT 04/19/2023, 4:13 PM

## 2023-04-20 ENCOUNTER — Ambulatory Visit: Payer: Self-pay | Admitting: Psychiatry

## 2023-04-24 ENCOUNTER — Ambulatory Visit: Payer: Self-pay | Admitting: Psychiatry

## 2023-04-24 ENCOUNTER — Ambulatory Visit: Payer: 59

## 2023-04-24 ENCOUNTER — Encounter: Payer: Self-pay | Admitting: Psychiatry

## 2023-04-24 VITALS — BP 124/84 | HR 87 | Temp 98.9°F | Ht 72.0 in | Wt 218.4 lb

## 2023-04-24 DIAGNOSIS — F4321 Adjustment disorder with depressed mood: Secondary | ICD-10-CM | POA: Diagnosis not present

## 2023-04-24 DIAGNOSIS — R42 Dizziness and giddiness: Secondary | ICD-10-CM

## 2023-04-24 DIAGNOSIS — F411 Generalized anxiety disorder: Secondary | ICD-10-CM | POA: Diagnosis not present

## 2023-04-24 DIAGNOSIS — F331 Major depressive disorder, recurrent, moderate: Secondary | ICD-10-CM

## 2023-04-24 DIAGNOSIS — F4323 Adjustment disorder with mixed anxiety and depressed mood: Secondary | ICD-10-CM

## 2023-04-24 DIAGNOSIS — M5459 Other low back pain: Secondary | ICD-10-CM

## 2023-04-24 MED ORDER — DESVENLAFAXINE SUCCINATE ER 50 MG PO TB24: 50.0000 mg | ORAL_TABLET | Freq: Every day | ORAL | 1 refills | Status: AC

## 2023-04-24 NOTE — Therapy (Signed)
 OUTPATIENT PHYSICAL THERAPY VESTIBULAR TREATMENT     Patient Name: George [name] MRN: [id number] DOB:[birth year], 48 y.o., adult Today's Date: 04/24/2023  END OF SESSION:  PT End of Session - 04/24/23 1616     Visit Number 6    Number of Visits 25    Date for PT Re-Evaluation 06/26/23    PT Start Time 1616    PT Stop Time 1700    PT Time Calculation (min) 44 min    Activity Tolerance Patient tolerated treatment well;Patient limited by pain    Behavior During Therapy [facility] for tasks assessed/performed               Past Medical History:  Diagnosis Date   Back pain    Hypercholesterolemia    History reviewed. No pertinent surgical history. There are no active problems to display for this patient.   PCP: Jerl Mina, MD REFERRING PROVIDER: Lonell Face, MD  REFERRING DIAG:   R42 (ICD-10-CM) - Dizziness    THERAPY DIAG:  Dizziness and giddiness  Other low back pain  ONSET DATE: November 2024  Rationale for Evaluation and Treatment: Rehabilitation  SUBJECTIVE:   SUBJECTIVE STATEMENT:  Pt got a lot of sleep last night. He reports he felt more dizzy than usual when waking up. He felt too dizzy to shave. Describes as feeling off balance, woozy, & lightheaded. He had a busy weekend, rode on recumbent bike for 30 min, already went for walk today but not as much as usual.  Thinks leg press may have bothered his back. Pt is moving soon.   Pt accompanied by: self  PERTINENT HISTORY:   From eval:  The pt is a pleasant 48 y/o male presenting to PT for dizziness, reports onset mid November 2024. Pt initially thought he was getting sick, reports was diagnosed with L ear infection, but his R ear was bothering him more. Dizziness continued with no improvement into January. At this time pt prescribed prednisone taper. He does not experience vertigo, his symptoms include a swimmy-headed sensation that started to worsen, he also experiences a bilateral pressure  in his head and R ear pain. Pt with negative MRI.  He reports his neuro thinks anxiety could be contributing factor. Pt with recent life stressors with unexpected split from his spouse. He was prescribed diazepam which was initially helpful, but no longer helps.Pt notes feeling unbalanced with gait. Pt will at times use a cane to walk due to chronic LBP. Dizzy symptoms are worse with movement. He reports he stays mostly at home now, not as active as before. He reports poor sleep 3-6 hours/night. He experiences noise sensitivity but not light or smell sensitivity. Pt has a 3 y/o daughter. On disability due to chronic LBP. Pt reports taking multiple supplements.  Per pt and chart: Ongoing dizziness >3 mo, no clear etiology based on MRI brain and ENT VNG testing eval per neuro note. Symptoms are intermittent, intensity varies, pt experiences head pressure and occ sharp ear pain. No vertigo, but does feel unbalanced, equilibrium is off, worse upon waking and with physical activity. Pt prescribed valium for symptoms. Hx of L sensorineural hearing loss, hx of sinus and ear infections. Pt with chronic LBP (on disability).    PAIN:  Are you having pain?  Chronic LBP  PRECAUTIONS: Fall    WEIGHT BEARING RESTRICTIONS: No  FALLS: Has patient fallen in last 6 months? No  LIVING ENVIRONMENT: Lives with: lives with their family and lives with their daughter  PLOF: Independent  PATIENT GOALS: Get rid of my dizziness   OBJECTIVE:  Note: Objective measures were completed at Evaluation unless otherwise noted.  DIAGNOSTIC FINDINGS:  via chart  03/23/2023 MR BRAIN "IMPRESSION: 1. Unremarkable MRI appearance of the brain. No evidence of an acute intracranial abnormality. 2. No cerebellopontine angle or internal auditory canal mass. 3. No etiology of dizziness is identified.     Electronically Signed   By: Jackey Loge D.O.   On: 03/23/2023 17:17"  COGNITION: Overall cognitive status: Within  functional limits for tasks assessed   POSTURE:  No Significant postural limitations    TRANSFERS: Assistive device utilized: None  Sit to stand: Complete Independence Stand to sit: Complete Independence Chair to chair: Complete Independence   GAIT: Gait pattern:  impaired with scanning/use of head turns Distance walked: clinic distances Assistive device utilized: None   FUNCTIONAL TESTS:  Dynamic Gait Index: 22/24  PATIENT SURVEYS:  DHI 64 moderate  VESTIBULAR ASSESSMENT:   OCULOMOTOR EXAM:  Ocular Alignment: normal  Ocular ROM: No Limitations  Spontaneous Nystagmus: absent  Gaze-Induced Nystagmus: age appropriate nystagmus at end range  Smooth Pursuits: intact  Saccades: intact  Convergence/Divergence: WNL  VESTIBULAR - OCULAR REFLEX:   Slow VOR: Normal  VOR Cancellation: Normal  Head-Impulse Test: negative bilaterally    Modified Motion Sensitivity Test  Movement Intensity (change from baseline, 0-10, no change=0, severe change=10) Duration  <5 sec = 0  5-10 s = 1 11-20 s = 2 21-30 s = 3 >30 s = 4 Score (Intensity + Duration)  5x Horizontal head turns 2 3 5   5x Vertical head turns 1 3 4   5x Right diagonal head turns (upper left quadrant down to right) 1 2 3   5x Left diagonal head turns (upper right quadrant down to left) 1 0 1  5x Trunk Bends (bending knees reaching to floor) 0 0   5x Right quarter body urns (look over right shoulder with trunk rotation, feet planted) 1 3 4   5x Left quarter body turns (look over left shoulder with trunk rotation, feet planted) 1 2 3   1x 360 degree turn to right 2 3 5   1x 360 degree turn to left 1 1 2   5x VOR cancellation (follow thumbs horizontally with head/trunk rotation x45 degrees each way) 2 3 5   TOTAL SCORE      MSQ = total score x (# of positions)/14   0-10 mild range 11-30 moderate range 31-100 severe range   20.6 moderate   04/05/23: Vestibular goggles: Horizontal head shake test: negaitve Pressure  test: negative  DH - negative bilat                                                                                                                              TREATMENT DATE: 04/05/23  NMR:  Treadmill habituation/conditioning - reaches speed that feels like typical walking pace (>3 mph) - completes about 6 min with vertical head turns,  horizontal head turns, and diagonal head turns R>L, L>R. All make pt symptomatic, but vertical worst.  Habituation: Seated bend nose to L knee with sitting up 1x6  - modified to prevent LBP - "that made me feel fairly dizzy"    Seated VORx1 horizontal head turns 5x- made pt dizzier today  Mobility interventions to promote symptom modulation, decrease pain & dizziness, - completed as pt did note feeling tension in upper body with other interventions, mid and low back pain. Shoulder circles cw/cc 10x each each way - some discomfort in upper back/tension Shoulder shrugs 10x - not as bad, but still some discomfort Scapular squeezes 10x 3 sec - causes mid-back pain  Physioball rollouts FWD/BCKWD  10x-  makes back feel better  Physioball LTL rollouts x multiple reps each side, no pain UT stretch 2x30 sec each side - feels tension mid and upper back bilaterally, feels tighter on R Chin tuck (seated) 10x - can feel tension in neck/back  Home Management/Self-Care Discussion of use of ice vs. heat at home for symptom modulation, including safety with using either modality. PT reviewed with pt activities and symptom threshold, importance of changing to mobility or strengthening activities not known to provoke dizzy symptoms when pt having a high symptom day.   Pt reports dizziness is OK at end of session   Education details: exercise technique, recommendations Person educated: Patient Education method: Explanation, demo, vc,  Education comprehension: verbalized understanding  HOME EXERCISE PROGRAM:  Added: Access Code: [email or web address] URL:  [email or web address] Date: 04/12/2023 Prepared by: Temple Pacini  Exercises - Walking Forward Lunge  - 1 x daily - 3-5 x weekly - 2 sets - 10-20 reps  GOALS:  GOALS: Goals reviewed with patient? Yes  SHORT TERM GOALS: Target date: 05/15/2023    Patient will be independent in home exercise program to improve balance/dizziness and mobility for better functional independence with ADLs. Baseline:  Goal status: INITIAL   LONG TERM GOALS: Target date: 06/26/2023   1.  Patient will reduce dizziness handicap inventory score to <50, for less dizziness with ADLs and increased safety with home and work tasks.  Baseline: 64 Goal status: INITIAL  2.  Patient will report at least a 50% improvement in activity/movement provoked dizziness symptoms.  Baseline:  currently limiting usual activities due to symptoms Goal status: INITIAL  3.  Patient will increase dynamic gait index score to 24/24 as to demonstrate improved dynamic balance and ability to utilize head turns during gait for better safety with community/home ambulation.   Baseline: 22 Goal status: INITIAL  4.   Pt will demonstrate a score <10 on the MMS test to indicate improvement in motion sensitivity to mild. Baseline:  20.6 Goal status: INITIAL    ASSESSMENT:  CLINICAL IMPRESSION: Pt presents with increased dizziness at baseline today, likely impacted by increased back pain and activity levels over weekend. Vestibular interventions modified/limited as a result. Greater focus on symptom modulation/mobility activities. Pt responded best to physioball FWD/BCKWD rollouts with reported improvement in pain. The pt will benefit from further skilled PT to improve impairments in order to decrease dizziness, increase QOL and return to PLOF.   OBJECTIVE IMPAIRMENTS: Abnormal gait, decreased balance, decreased coordination, decreased mobility, difficulty walking, dizziness, improper body mechanics, and pain.   ACTIVITY  LIMITATIONS: lifting, bending, stairs, bed mobility, and locomotion level  PARTICIPATION LIMITATIONS: shopping, community activity, and yard work  PERSONAL FACTORS: Fitness, Past/current experiences, Time since onset of injury/illness/exacerbation, and 1 comorbidity: chronic LBP  are also affecting  patient's functional outcome.   REHAB POTENTIAL: Good  CLINICAL DECISION MAKING: Evolving/moderate complexity  EVALUATION COMPLEXITY: Moderate   PLAN:  PT FREQUENCY: 1-2x/week  PT DURATION: 12 weeks  PLANNED INTERVENTIONS: 97164- PT Re-evaluation, 97110-Therapeutic exercises, 97530- Therapeutic activity, [id number]- Neuromuscular re-education, 97535- Self Care, 28413- Manual therapy, [phone]- Gait training, [phone]- Orthotic Fit/training, [phone]- Canalith repositioning, [id number]- Electrical stimulation (manual), [phone]- Traction (mechanical), Patient/Family education, Balance training, Stair training, Dry Needling, Joint mobilization, Spinal mobilization, Vestibular training, DME instructions, Cryotherapy, and Moist heat  PLAN FOR NEXT SESSION: continue habituation interventions, strength training around pain UE and LE, mobility/symptom modulation   Baird Kay, PT 04/24/2023, 5:08 PM
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
## 2023-04-26 ENCOUNTER — Telehealth: Payer: Self-pay

## 2023-04-26 ENCOUNTER — Ambulatory Visit: Payer: 59

## 2023-04-26 NOTE — Telephone Encounter (Signed)
 PT returned pt's phone call via secure line. Pt reported he had to cancel PT today due to symptom flare-up/significant dizziness. He reports yesterday he was carrying heavy boxes, doing a lot of bending activities and suddenly felt head pressure symptoms, bilateral ear pain and dizziness. He reports he took some medication for sinus congestion with some short-term improvement in symptoms, but otherwise he has not experienced further improvement and is still dizzy (described as head pressure, imbalance). He reports trying vestibular exercises at the time which made symptoms worse. PT provided education not to perform visual/vestibular exercises when in a symptom flare-up, and instead focus on gentle & relaxing activities such as some of the mobility exercises completed the other day. PT also discussed rest during symptom flare, gentle walking without head turn component. PT also recommended follow-up with his ENT due to bilateral ear pain/ache. If pt still feeling bad/worsening symptoms try to be seen by physician or urgent care for follow-up. Pt verbalized understanding.   Temple Pacini PT, DPT

## 2023-05-03 ENCOUNTER — Ambulatory Visit: Payer: 59

## 2023-05-08 ENCOUNTER — Ambulatory Visit: Payer: 59

## 2023-05-10 ENCOUNTER — Ambulatory Visit: Payer: 59

## 2023-05-10 IMAGING — US US ABDOMEN LIMITED
1 series · 15 of 25 positions shown · non-contrast
Comparison: None.

CLINICAL DATA: Right upper quadrant pain x2 months.

EXAM:
ULTRASOUND ABDOMEN LIMITED RIGHT UPPER QUADRANT

[Series 1: us abdomen limited ruq · 15 of 79 slices shown]
[im 1/79]
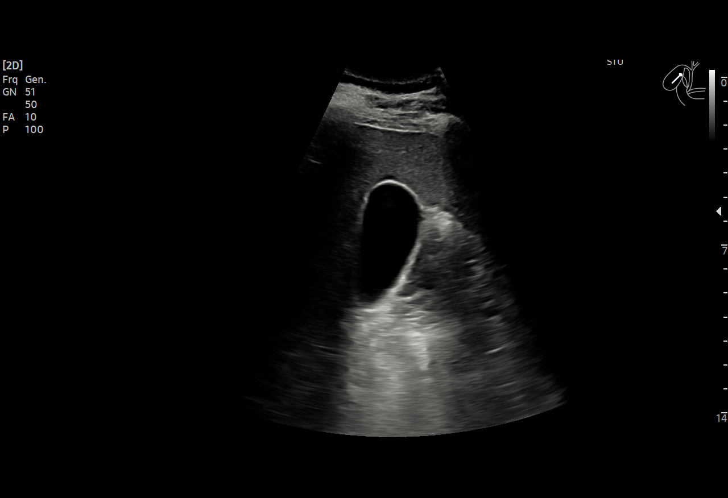
[im 7/79]
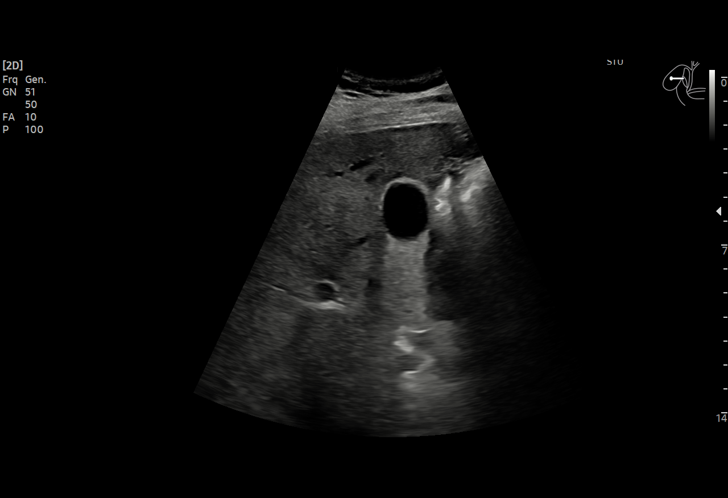
[im 14/79]
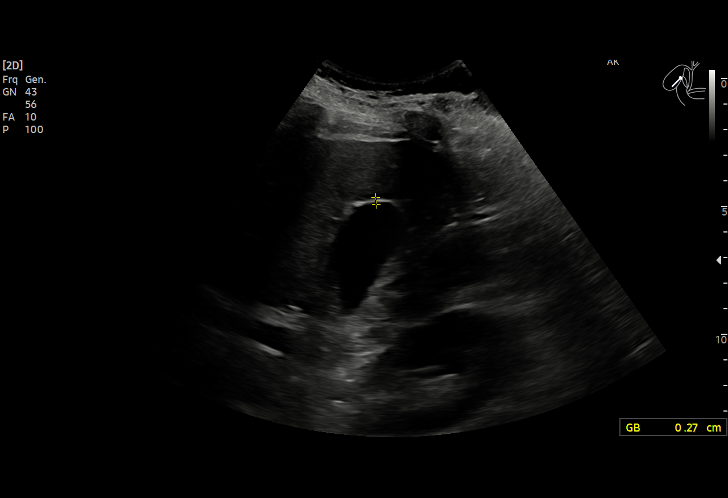
[im 17/79]
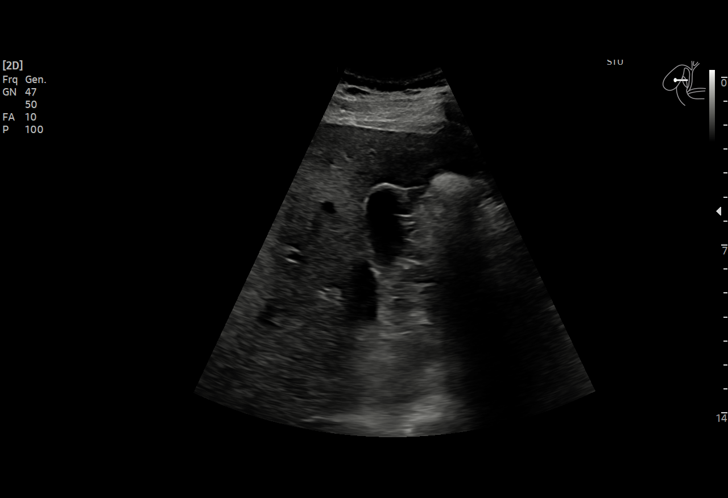
[im 23/79]
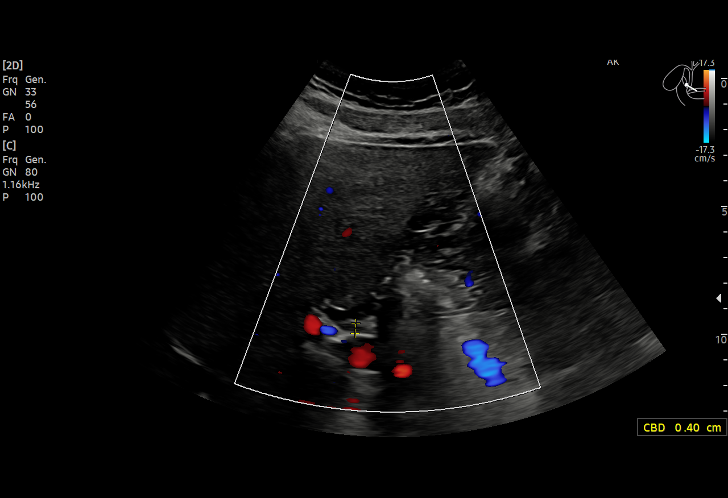
[im 30/79]
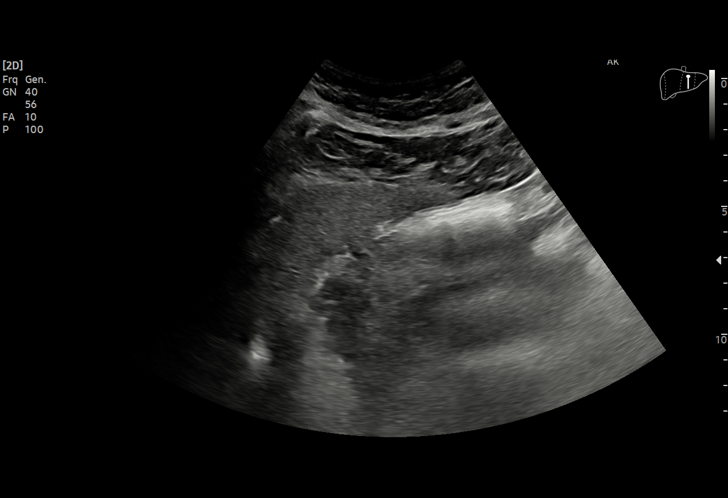
[im 33/79]
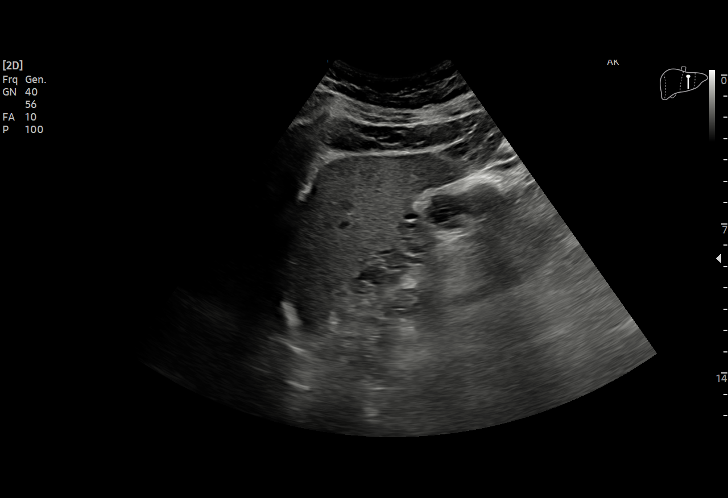
[im 40/79]
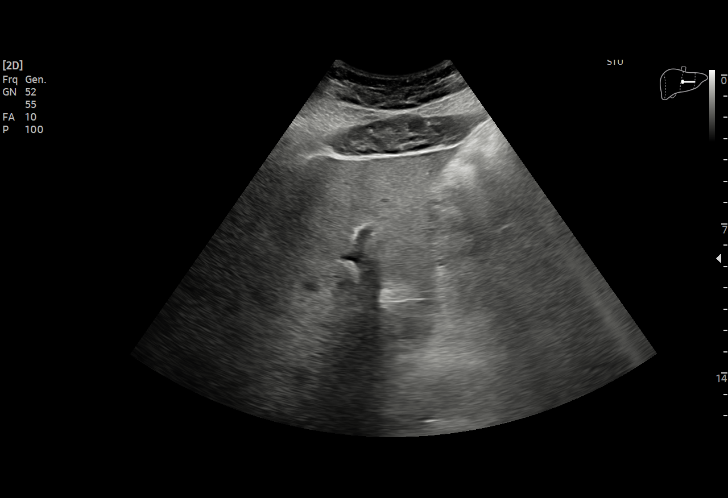
[im 46/79]
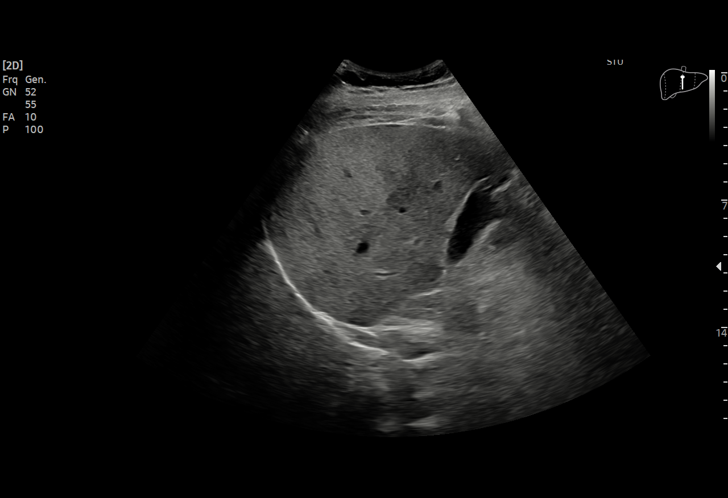
[im 49/79]
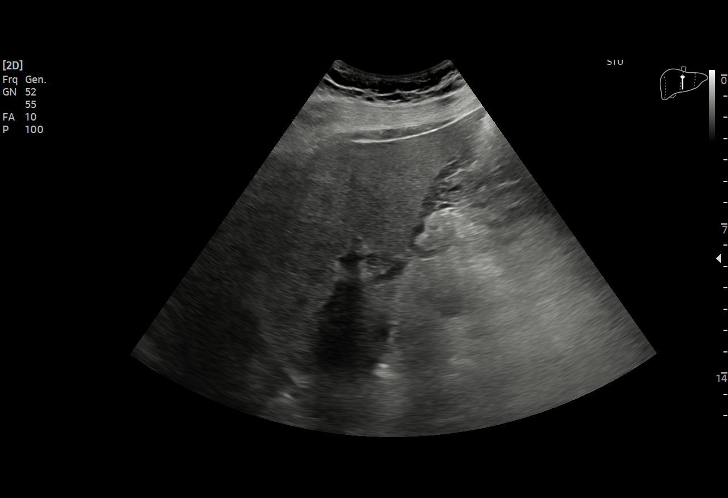
[im 56/79]
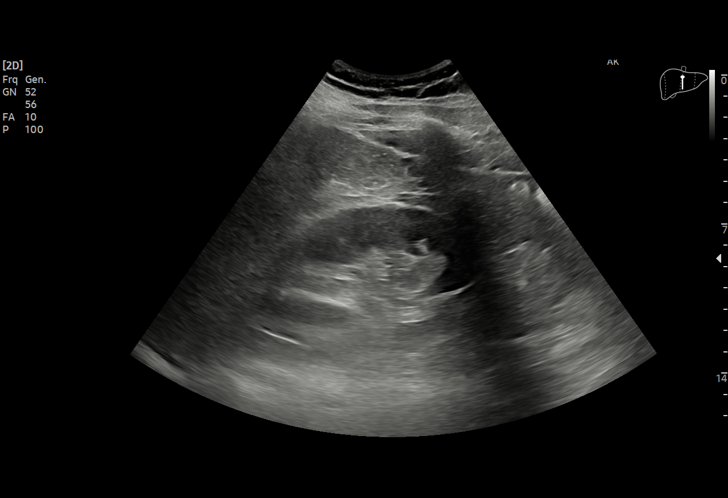
[im 62/79]
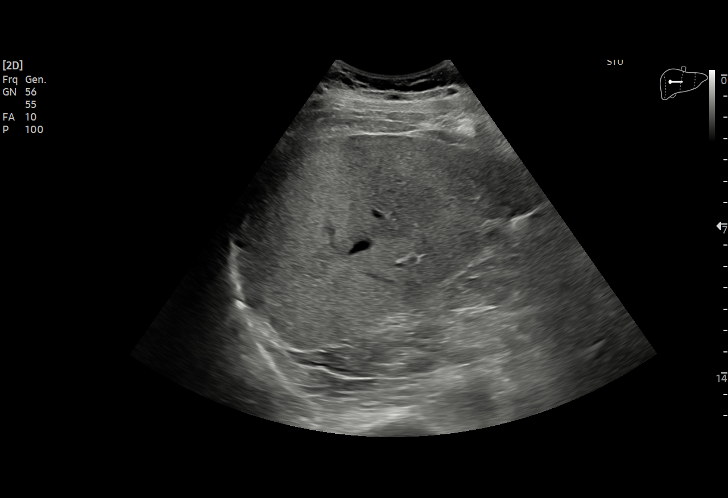
[im 66/79]
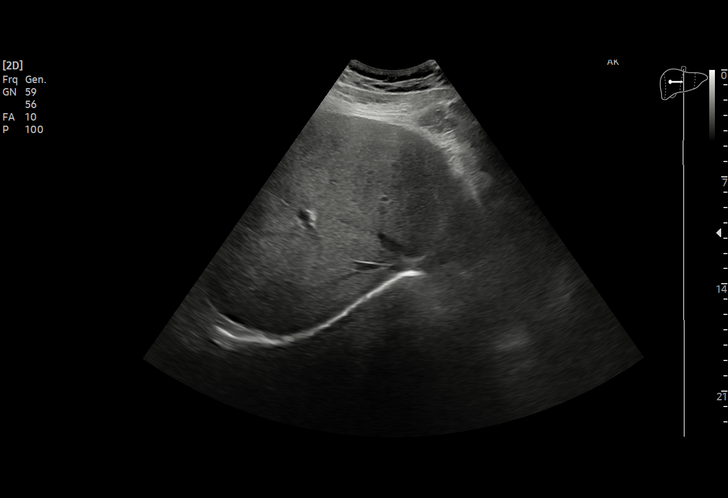
[im 72/79]
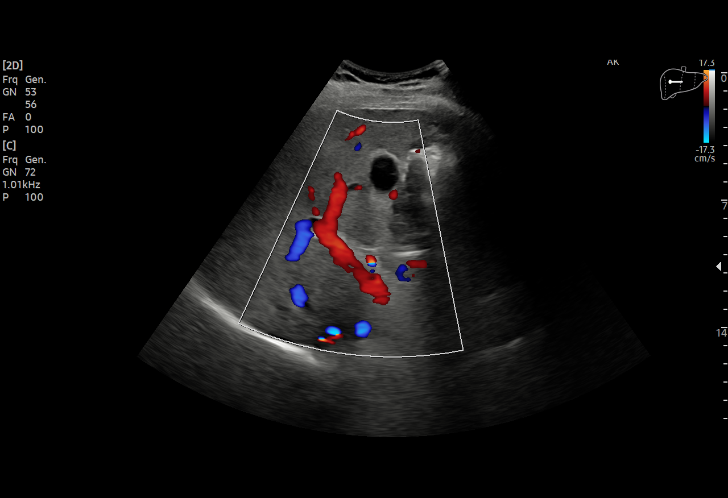
[im 79/79]
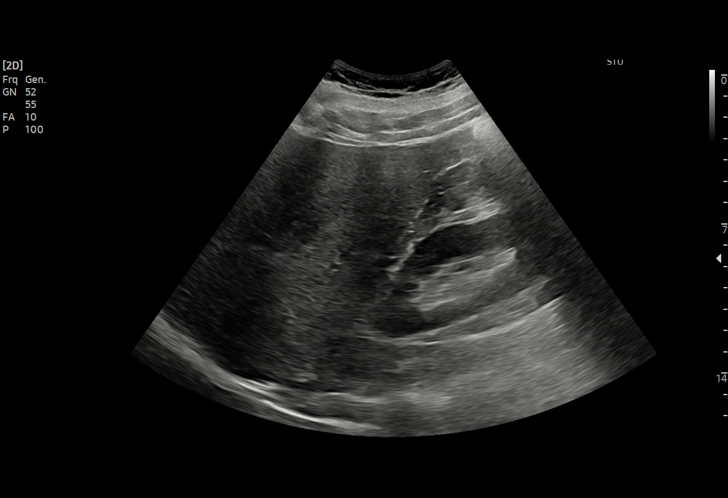

[15 of 25 positions shown; findings below may reference images not displayed]

FINDINGS: Gallbladder:

No gallstones or wall thickening visualized (2.7 mm). No sonographic
Murphy sign noted by sonographer.

Common bile duct:

Diameter: 4.0 mm

Liver:

No focal lesion identified. Diffusely increased echogenicity of the
parenchyma is noted. Portal vein is patent on color Doppler imaging
with normal direction of blood flow towards the liver.

Other: None.
IMPRESSION: Hepatic steatosis without focal liver lesions.

## 2023-05-12 ENCOUNTER — Telehealth: Payer: Self-pay | Admitting: Psychiatry

## 2023-05-12 NOTE — Telephone Encounter (Signed)
 Pt called to check on pt status on medication.  Pt reports that the Pristiq was causing more dizziness, so he stopped taking the medication after 8 days of therapy.  Pt reports no withdrawal symptoms and states that he is concerned about his dizziness and ear.  He has an appointment with his ear, nose, and throat doctor.   Pt states he would like to wait on medication changes until his appointment on 05/22/23.  Pt agrees to call the clinic should he have worsening symptoms.

## 2023-05-15 ENCOUNTER — Ambulatory Visit: Payer: 59

## 2023-05-17 ENCOUNTER — Ambulatory Visit: Payer: 59

## 2023-05-22 ENCOUNTER — Ambulatory Visit: Payer: 59

## 2023-05-22 ENCOUNTER — Ambulatory Visit: Admitting: Psychiatry

## 2023-05-24 ENCOUNTER — Ambulatory Visit: Payer: 59

## 2023-05-29 ENCOUNTER — Ambulatory Visit: Payer: 59

## 2023-05-31 ENCOUNTER — Ambulatory Visit: Payer: 59

## 2023-06-05 ENCOUNTER — Ambulatory Visit: Payer: 59

## 2023-06-21 ENCOUNTER — Ambulatory Visit: Payer: 59

## 2023-06-26 ENCOUNTER — Ambulatory Visit: Payer: 59

## 2023-06-28 ENCOUNTER — Ambulatory Visit: Payer: 59
